# Patient Record
Sex: Male | Born: 1950 | Race: White | Hispanic: No | Marital: Married | Smoking: Never smoker
Health system: Southern US, Community
[De-identification: ages and names within clinical notes are randomized; demographics above are authoritative.]

## PROBLEM LIST (undated history)

## (undated) DIAGNOSIS — N200 Calculus of kidney: Secondary | ICD-10-CM

## (undated) DIAGNOSIS — M1990 Unspecified osteoarthritis, unspecified site: Secondary | ICD-10-CM

## (undated) DIAGNOSIS — IMO0002 Reserved for concepts with insufficient information to code with codable children: Secondary | ICD-10-CM

## (undated) DIAGNOSIS — G4733 Obstructive sleep apnea (adult) (pediatric): Secondary | ICD-10-CM

## (undated) DIAGNOSIS — M109 Gout, unspecified: Secondary | ICD-10-CM

## (undated) DIAGNOSIS — Z91038 Other insect allergy status: Secondary | ICD-10-CM

## (undated) DIAGNOSIS — N529 Male erectile dysfunction, unspecified: Secondary | ICD-10-CM

## (undated) DIAGNOSIS — E7800 Pure hypercholesterolemia, unspecified: Secondary | ICD-10-CM

## (undated) DIAGNOSIS — I38 Endocarditis, valve unspecified: Secondary | ICD-10-CM

## (undated) DIAGNOSIS — R9430 Abnormal result of cardiovascular function study, unspecified: Secondary | ICD-10-CM

## (undated) HISTORY — PX: FEMUR SURGERY: SHX943

## (undated) HISTORY — PX: NASAL SEPTUM SURGERY: SHX37

## (undated) HISTORY — DX: Unspecified osteoarthritis, unspecified site: M19.90

## (undated) HISTORY — DX: Pure hypercholesterolemia, unspecified: E78.00

## (undated) HISTORY — DX: Endocarditis, valve unspecified: I38

## (undated) HISTORY — DX: Gout, unspecified: M10.9

## (undated) HISTORY — DX: Calculus of kidney: N20.0

## (undated) HISTORY — DX: Obstructive sleep apnea (adult) (pediatric): G47.33

## (undated) HISTORY — DX: Other insect allergy status: Z91.038

## (undated) HISTORY — DX: Abnormal result of cardiovascular function study, unspecified: R94.30

## (undated) HISTORY — DX: Reserved for concepts with insufficient information to code with codable children: IMO0002

## (undated) HISTORY — DX: Male erectile dysfunction, unspecified: N52.9

## (undated) HISTORY — PX: COLONOSCOPY: SHX174

---

## 2021-02-14 ENCOUNTER — Encounter: Payer: Self-pay | Admitting: Cardiology

## 2021-02-14 DIAGNOSIS — N529 Male erectile dysfunction, unspecified: Secondary | ICD-10-CM | POA: Insufficient documentation

## 2021-02-14 DIAGNOSIS — M1990 Unspecified osteoarthritis, unspecified site: Secondary | ICD-10-CM | POA: Insufficient documentation

## 2021-02-14 DIAGNOSIS — G4733 Obstructive sleep apnea (adult) (pediatric): Secondary | ICD-10-CM | POA: Insufficient documentation

## 2021-02-14 DIAGNOSIS — E7800 Pure hypercholesterolemia, unspecified: Secondary | ICD-10-CM | POA: Insufficient documentation

## 2021-02-14 DIAGNOSIS — I38 Endocarditis, valve unspecified: Secondary | ICD-10-CM

## 2021-02-14 DIAGNOSIS — R9430 Abnormal result of cardiovascular function study, unspecified: Secondary | ICD-10-CM | POA: Insufficient documentation

## 2021-02-18 DIAGNOSIS — Z91038 Other insect allergy status: Secondary | ICD-10-CM | POA: Insufficient documentation

## 2021-02-18 DIAGNOSIS — M109 Gout, unspecified: Secondary | ICD-10-CM | POA: Insufficient documentation

## 2021-02-20 ENCOUNTER — Other Ambulatory Visit: Payer: Self-pay

## 2021-02-20 ENCOUNTER — Ambulatory Visit (INDEPENDENT_AMBULATORY_CARE_PROVIDER_SITE_OTHER): Payer: Medicare Other | Admitting: Cardiology

## 2021-02-20 ENCOUNTER — Encounter: Payer: Self-pay | Admitting: Cardiology

## 2021-02-20 VITALS — BP 116/78 | HR 99 | Ht 69.0 in | Wt 193.2 lb

## 2021-02-20 DIAGNOSIS — I4819 Other persistent atrial fibrillation: Secondary | ICD-10-CM | POA: Diagnosis not present

## 2021-02-20 DIAGNOSIS — I38 Endocarditis, valve unspecified: Secondary | ICD-10-CM | POA: Diagnosis not present

## 2021-02-20 DIAGNOSIS — G4733 Obstructive sleep apnea (adult) (pediatric): Secondary | ICD-10-CM

## 2021-02-20 DIAGNOSIS — E7800 Pure hypercholesterolemia, unspecified: Secondary | ICD-10-CM

## 2021-02-20 DIAGNOSIS — N200 Calculus of kidney: Secondary | ICD-10-CM | POA: Insufficient documentation

## 2021-02-20 MED ORDER — APIXABAN 5 MG PO TABS: 5.0000 mg | ORAL_TABLET | Freq: Two times a day (BID) | ORAL | 3 refills | Status: DC

## 2021-02-20 NOTE — Progress Notes (Signed)
Cardiology Office Note:    Date:  02/20/2021   ID:  Joseph Lynn, DOB 10-Jul-1950, MRN [id number]  PCP:  Noni Saupe, MD  Cardiologist:  Norman Herrlich, MD   Referring MD: Noni Saupe, MD  ASSESSMENT:    1. Persistent atrial fibrillation (HCC)   2. Hypercholesteremia   3. OSA (obstructive sleep apnea)   4. Valvular heart disease    PLAN:    In order of problems listed above:  He now has persistent atrial fibrillation initiate anticoagulation and referred to EP for consideration isolation versus antiarrhythmic therapy and cardioversion.  He is rate controlled does not require suppression  Stable currently not on lipid-lowering treatment Stable on CPAP My opinion a valvular regurgitation here is trivial physiologic and I told him I do not consider he has valvular heart disease He has a colonoscopy scheduled for tomorrow I told him to ask gastroenterology when he can initiate his anticoagulant  Next appointment as needed with me he will be set up in the next few weeks to be seen by EP   Medication Adjustments/Labs and Tests Ordered: Current medicines are reviewed at length with the patient today.  Concerns regarding medicines are outlined above.  Orders Placed This Encounter  Procedures   Ambulatory referral to Cardiac Electrophysiology   EKG 12-Lead   Meds ordered this encounter  Medications   apixaban (ELIQUIS) 5 MG TABS tablet    Sig: Take 1 tablet (5 mg total) by mouth 2 (two) times daily.    Dispense:  180 tablet    Refill:  3     Chief complaint: Atrial fibrillation  History of Present Illness:    Joseph Lynn is a 70 y.o. male who is being seen today for the evaluation of atrial fibrillation and Other day at the request of [place], John F. II, MD. although His office notes associated irregular heart rhythm when he was in atrial fibrillation at his office  visit                                                                                             There is report of an echocardiogram that was performed February 2021.  Left ventricle normal in size wall thickness systolic EF 57% right ventricle normal size and function there is trivial physiologic valvular regurgitation.  The left and right atrium were described as normal.6666 Recent labs 01/09/2021 showed normal CBC hemoglobin 15.5 platelets 198,000 CMP with a creatinine of 1.0 GFR greater than 60 cc sodium 141 potassium 4.4 otherwise normal CMP cholesterol 200 LDL 133 triglycerides 60 HDL 55 his TSH was normal at that time in the office EKG 01/09/2021 shows rate controlled atrial fibrillation   He and his wife are present participate in evaluation decision making he has a history dating back to February 2021 when he was donating blood he been told that his pulse was irregular he was told he had atrial premature contractions and bradycardia and has been using a device to monitor his rhythm at home.  Initially it was sinus rhythm at times with rates less than 60 but he  was in atrial fibrillation on 01/22/2019 to 01/22/2021 he was in sinus rhythm 01/27/2021 he was in atrial fibrillation 02/15/2021 and asymptomatic controlled atrial fibrillation today. He has no history of thyroid disease he has sleep apnea and uses CPAP. He has moderate alcohol intake 1-2 beers per day He has no history of congenital rheumatic heart disease. He was left with the impression that his echocardiogram showed valvular heart disease and I reassured him that it is within the realm of normal or near normal for his age group and that I do not consider he has valvular heart disease.  We discussed options for treatment the first would be simply to anticoagulate and avoid antiarrhythmic drugs or intervention as he is asymptomatic. Other options include antiarrhythmic drug therapy cardioversion or referral for EP intervention. I think he is ideally suited for EP intervention he is quite interested will be scheduled to be  seen by Dr. Elberta Fortis my office We discussed that anticoagulation with a CHA2DS2-VASc score of 1 is optional however his risk is not 0% and he prefers anticoagulation will initiate today especially if he is going to be considering catheter ablation.  He has not having palpitations syncope shortness of breath chest pain or exercise intolerance. Past Medical History:  Diagnosis Date   Arthritis, degenerative    Erectile dysfunction    Gout    Hymenoptera allergy    Hypercholesteremia    Kidney stones    Left ventricular ejection fraction greater than 40%    OSA (obstructive sleep apnea)    on CPAP   Valvular heart disease     Past Surgical History:  Procedure Laterality Date   COLONOSCOPY     X 4   FEMUR SURGERY     NASAL SEPTUM SURGERY      Current Medications: Current Meds  Medication Sig   apixaban (ELIQUIS) 5 MG TABS tablet Take 1 tablet (5 mg total) by mouth 2 (two) times daily.   EPINEPHrine 0.3 mg/0.3 mL IJ SOAJ injection Inject 0.3 mg into the muscle as needed for anaphylaxis.   sildenafil (VIAGRA) 50 MG tablet Take 50 mg by mouth daily as needed for erectile dysfunction.     Allergies:   Bee venom   Social History   Socioeconomic History   Marital status: Married    Spouse name: Not on file   Number of children: Not on file   Years of education: Not on file   Highest education level: Not on file  Occupational History   Not on file  Tobacco Use   Smoking status: Never   Smokeless tobacco: Never  Vaping Use   Vaping Use: Never used  Substance and Sexual Activity   Alcohol use: Yes    Alcohol/week: 7.0 - 14.0 standard drinks    Types: 7 - 14 Cans of beer per week   Drug use: Never   Sexual activity: Not on file  Other Topics Concern   Not on file  Social History Narrative   Not on file   Social Determinants of Health   Financial Resource Strain: Not on file  Food Insecurity: Not on file  Transportation Needs: Not on file  Physical Activity: Not on  file  Stress: Not on file  Social Connections: Not on file     Family History: The patient's family history includes Cerebral palsy in his sister; Coronary artery disease in his father; Lung cancer in his father; Mental illness in his mother and sister; Ovarian cancer in his mother.  ROS:   ROS Please see the history of present illness.     all other systems reviewed and are negative.  EKGs/Labs/Other Studies Reviewed:    The following studies were reviewed today:   EKG:  EKG is  ordered today.  The ekg ordered today is personally reviewed and demonstrates atrial fibrillation controlled ventricular rate  Recent Labs: 01/09/2021: Cholesterol 200 triglycerides 60 HDL 55 TSH 1.57 hemoglobin 15.5 creatinine 1.0  Physical Exam:    VS:  BP 116/78   Pulse 99   Ht 5\' 9"  (1.753 m)   Wt 193 lb 3.2 oz (87.6 kg)   SpO2 98%   BMI 28.53 kg/m     Wt Readings from Last 3 Encounters:  02/20/21 193 lb 3.2 oz (87.6 kg)  01/09/21 194 lb (88 kg)     GEN:  Well nourished, well developed in no acute distress he looks young for his age appears healthy HEENT: Normal NECK: No JVD; No carotid bruits LYMPHATICS: No lymphadenopathy CARDIAC: Irregular rate and rhythm RRR, no murmurs, rubs, gallops RESPIRATORY:  Clear to auscultation without rales, wheezing or rhonchi  ABDOMEN: Soft, non-tender, non-distended MUSCULOSKELETAL:  No edema; No deformity  SKIN: Warm and dry NEUROLOGIC:  Alert and oriented x 3 PSYCHIATRIC:  Normal affect     Signed, 03/11/21, MD  02/20/2021 12:08 PM    [facility]

## 2021-02-20 NOTE — Patient Instructions (Signed)
Medication Instructions:  Your physician has recommended you make the following change in your medication:  AFTER YOUR COLONOSCOPY START: Eliquis 5 mg take one tablet by mouth daily.  *If you need a refill on your cardiac medications before your next appointment, please call your pharmacy*   Lab Work: None If you have labs (blood work) drawn today and your tests are completely normal, you will receive your results only by: MyChart Message (if you have MyChart) OR A paper copy in the mail If you have any lab test that is abnormal or we need to change your treatment, we will call you to review the results.   Testing/Procedures: None   Follow-Up: At [facility], you and your health needs are our priority.  As part of our continuing mission to provide you with exceptional heart care, we have created designated Provider Care Teams.  These Care Teams include your primary Cardiologist (physician) and Advanced Practice Providers (APPs -  Physician Assistants and Nurse Practitioners) who all work together to provide you with the care you need, when you need it.  We recommend signing up for the patient portal called "MyChart".  Sign up information is provided on this After Visit Summary.  MyChart is used to connect with patients for Virtual Visits (Telemedicine).  Patients are able to view lab/test results, encounter notes, upcoming appointments, etc.  Non-urgent messages can be sent to your provider as well.   To learn more about what you can do with MyChart, go to [email or web address].    Your next appointment:   As needed  The format for your next appointment:   In Person  Provider:   Norman Herrlich, MD    Other Instructions

## 2021-03-25 ENCOUNTER — Ambulatory Visit (INDEPENDENT_AMBULATORY_CARE_PROVIDER_SITE_OTHER): Payer: Medicare Other | Admitting: Cardiology

## 2021-03-25 ENCOUNTER — Other Ambulatory Visit: Payer: Self-pay

## 2021-03-25 ENCOUNTER — Encounter: Payer: Self-pay | Admitting: Cardiology

## 2021-03-25 VITALS — BP 102/74 | HR 55 | Ht 69.0 in | Wt 194.9 lb

## 2021-03-25 DIAGNOSIS — I4819 Other persistent atrial fibrillation: Secondary | ICD-10-CM

## 2021-03-25 DIAGNOSIS — Z01818 Encounter for other preprocedural examination: Secondary | ICD-10-CM | POA: Diagnosis not present

## 2021-03-25 DIAGNOSIS — Z01812 Encounter for preprocedural laboratory examination: Secondary | ICD-10-CM

## 2021-03-25 NOTE — Patient Instructions (Addendum)
Medication Instructions:  Your physician recommends that you continue on your current medications as directed. Please refer to the Current Medication list given to you today.  *If you need a refill on your cardiac medications before your next appointment, please call your pharmacy*   Lab Work: Pre procedure labs between 05/12/2021 - 05/23/2021:  BMP & CBC You do NOT need to be fasting.  You can stop by the [place] office   If you have labs (blood work) drawn today and your tests are completely normal, you will receive your results only by: MyChart Message (if you have MyChart) OR A paper copy in the mail If you have any lab test that is abnormal or we need to change your treatment, we will call you to review the results.   Testing/Procedures: Your physician has requested that you have cardiac CT within 7 days PRIOR to your ablation. Cardiac computed tomography (CT) is a painless test that uses an x-ray machine to take clear, detailed pictures of your heart.  Please follow instruction below located under "other instructions". You will get a call from our office to schedule the date for this test.  Your physician has recommended that you have an ablation. Catheter ablation is a medical procedure used to treat some cardiac arrhythmias (irregular heartbeats). During catheter ablation, a long, thin, flexible tube is put into a blood vessel in your groin (upper thigh), or neck. This tube is called an ablation catheter. It is then guided to your heart through the blood vessel. Radio frequency waves destroy small areas of heart tissue where abnormal heartbeats may cause an arrhythmia to start. Please follow instruction below located under "other instructions".   Follow-Up: At [facility], you and your health needs are our priority.  As part of our continuing mission to provide you with exceptional heart care, we have created designated Provider Care Teams.  These Care Teams include your primary  Cardiologist (physician) and Advanced Practice Providers (APPs -  Physician Assistants and Nurse Practitioners) who all work together to provide you with the care you need, when you need it.  We recommend signing up for the patient portal called "MyChart".  Sign up information is provided on this After Visit Summary.  MyChart is used to connect with patients for Virtual Visits (Telemedicine).  Patients are able to view lab/test results, encounter notes, upcoming appointments, etc.  Non-urgent messages can be sent to your provider as well.   To learn more about what you can do with MyChart, go to [email or web address].    Your next appointment:   1 month(s) after your ablation  The format for your next appointment:   In Person  Provider:   AFib clinic   Thank you for choosing [facility]!!   Dory Horn, RN [phone]    Other Instructions   CT INSTRUCTIONS Your cardiac CT will be scheduled at:  [facility] [address] [place], [place] [address] [phone]  Please arrive at the [facility] main entrance of [facility] 30 minutes prior to test start time. Proceed to the [facility] Department (first floor) to check-in and test prep.   Please follow these instructions carefully (unless otherwise directed):  Hold all erectile dysfunction medications at least 3 days (72 hrs) prior to test.  On the Night Before the Test: Be sure to Drink plenty of water. Do not consume any caffeinated/decaffeinated beverages or chocolate 12 hours prior to your test. Do not take any antihistamines 12  hours prior to your test.  On the Day of the Test: Drink plenty of water until 1 hour prior to the test. Do not eat any food 4 hours prior to the test. You may take your regular medications prior to the test.  HOLD Furosemide/Hydrochlorothiazide morning of the test.      After the Test: Drink plenty of water. After receiving IV contrast, you may  experience a mild flushed feeling. This is normal. On occasion, you may experience a mild rash up to 24 hours after the test. This is not dangerous. If this occurs, you can take Benadryl 25 mg and increase your fluid intake. If you experience trouble breathing, this can be serious. If it is severe call 911 IMMEDIATELY. If it is mild, please call our office. If you take any of these medications: Glipizide/Metformin, Avandament, Glucavance, please do not take 48 hours after completing test unless otherwise instructed.   Once we have confirmed authorization from your insurance company, we will call you to set up a date and time for your test. Based on how quickly your insurance processes prior authorizations requests, please allow up to 4 weeks to be contacted for scheduling your Cardiac CT appointment. Be advised that routine Cardiac CT appointments could be scheduled as many as 8 weeks after your provider has ordered it.  For non-scheduling related questions, please contact the cardiac imaging nurse navigator should you have any questions/concerns: Rockwell Alexandria, Cardiac Imaging Nurse Navigator Larey Brick, Cardiac Imaging Nurse Navigator [facility] Direct Office Dial: [phone]   For scheduling needs, including cancellations and rescheduling, please call [place], [phone].      Electrophysiology/Ablation Procedure Instructions   You are scheduled for a(n)  ablation on 06/06/2021 with Dr. Loman Brooklyn.   1.   Pre procedure testing-             A.  LAB WORK --- between 05/12/2021 - 05/23/2021:  BMP & CBC You do NOT need to be fasting.  You can stop by the [place] office    On the day of your procedure 06/06/2021 you will go to [facility] hospital ([address]) at 5:30 am.  Bonita Quin will go to the main entrance [address] [employer]) and enter where the [employer] are.  Your driver will drop you off and you will head down the hallway to ADMITTING.  You  may have one support person come in to the hospital with you.  They will be asked to wait in the waiting room. It is OK to have someone drop you off and come back when you are ready to be discharged.   3.   Do not eat or drink after midnight prior to your procedure.   4.   On the morning of your procedure do NOT take any medication. Do not miss any doses of your blood thinner prior to the morning of your procedure or your procedure will need to be rescheduled.   5.  Plan for an overnight stay but you may be discharged after your procedure, if you use your phone frequently bring your phone charger. If you are discharged after your procedure you will need someone to drive you home and be with you for 24 hours after your procedure.   6. You will follow up with the AFIB clinic 4 weeks after your procedure.  You will follow up with Dr. Elberta Fortis  3 months after your procedure.  These appointments will be made for  you.   7. FYI: For your safety, and to allow [place] to monitor your vital signs accurately during the surgery/procedure we request that if you have artificial nails, gel coating, SNS etc. Please have those removed prior to your surgery/procedure. Not having the nail coverings /polish removed may result in cancellation or delay of your surgery/procedure.  * If you have ANY questions please call the office [phone] and ask for Fayne Mcguffee RN or send me a MyChart message   * Occasionally, EP Studies and ablations can become lengthy.  Please make your family aware of this before your procedure starts.  Average time ranges from 2-8 hours for EP studies/ablations.  Your physician will call your family after the procedure with the results.                                    Cardiac Ablation Cardiac ablation is a procedure to destroy (ablate) some heart tissue that is sending bad signals. These bad signals cause problems in heart rhythm. The heart has many areas that make these signals. If there are  problems in these areas, they can make the heart beat in a way that is not normal. Destroying some tissues can help make the heart rhythm normal. Tell your doctor about: Any allergies you have. All medicines you are taking. These include vitamins, herbs, eye drops, creams, and over-the-counter medicines. Any problems you or family members have had with medicines that make you fall asleep (anesthetics). Any blood disorders you have. Any surgeries you have had. Any medical conditions you have, such as kidney failure. Whether you are pregnant or may be pregnant. What are the risks? This is a safe procedure. But problems may occur, including: Infection. Bruising and bleeding. Bleeding into the chest. Stroke or blood clots. Damage to nearby areas of your body. Allergies to medicines or dyes. The need for a pacemaker if the normal system is damaged. Failure of the procedure to treat the problem. What happens before the procedure? Medicines Ask your doctor about: Changing or stopping your normal medicines. This is important. Taking aspirin and ibuprofen. Do not take these medicines unless your doctor tells you to take them. Taking other medicines, vitamins, herbs, and supplements. General instructions Follow instructions from your doctor about what you cannot eat or drink. Plan to have someone take you home from the hospital or clinic. If you will be going home right after the procedure, plan to have someone with you for 24 hours. Ask your doctor what steps will be taken to prevent infection. What happens during the procedure?  An IV tube will be put into one of your veins. You will be given a medicine to help you relax. The skin on your neck or groin will be numbed. A cut (incision) will be made in your neck or groin. A needle will be put through your cut and into a large vein. A tube (catheter) will be put into the needle. The tube will be moved to your heart. Dye may be put through  the tube. This helps your doctor see your heart. Small devices (electrodes) on the tube will send out signals. A type of energy will be used to destroy some heart tissue. The tube will be taken out. Pressure will be held on your cut. This helps stop bleeding. A bandage will be put over your cut. The exact procedure may vary among doctors and hospitals. What  happens after the procedure? You will be watched until you leave the hospital or clinic. This includes checking your heart rate, breathing rate, oxygen, and blood pressure. Your cut will be watched for bleeding. You will need to lie still for a few hours. Do not drive for 24 hours or as long as your doctor tells you. Summary Cardiac ablation is a procedure to destroy some heart tissue. This is done to treat heart rhythm problems. Tell your doctor about any medical conditions you may have. Tell him or her about all medicines you are taking to treat them. This is a safe procedure. But problems may occur. These include infection, bruising, bleeding, and damage to nearby areas of your body. Follow what your doctor tells you about food and drink. You may also be told to change or stop some of your medicines. After the procedure, do not drive for 24 hours or as long as your doctor tells you. This information is not intended to replace advice given to you by your health care provider. Make sure you discuss any questions you have with your health care provider. Document Revised: 02/23/2019 Document Reviewed: 02/23/2019 Elsevier Patient Education  2022 [employer].

## 2021-03-25 NOTE — Progress Notes (Signed)
Electrophysiology Office Note   Date:  03/25/2021   ID:  Joseph Lynn, Joseph Lynn June 05, 1950, MRN [id number]  PCP:  Noni Saupe, MD  Cardiologist:  Dulce Sellar Primary Electrophysiologist:  Raye Wiens Joseph Loa, MD    Chief Complaint: AF   History of Present Illness: Joseph Lynn is a 70 y.o. male who is being seen today for the evaluation of AF at the request of Dulce Sellar, Iline Oven, MD. Presenting today for electrophysiology evaluation.  He has a history significant for hyperlipidemia, obstructive sleep apnea, valvular heart disease, and atrial fibrillation.  February 2021, he was donating blood and was found to have an irregular pulse.  He was having PACs and bradycardia.  01/27/2021, he was found to be in atrial fibrillation.  He has no history of thyroid disease.  He does have sleep apnea and uses a CPAP.  He drinks 1-2 beers per day.  He is minimally symptomatic from his atrial fibrillation.  He states that he has potentially some mild shortness of breath.  He is able to do all his daily activities, though when he is atrial fibrillation, moves a little bit slower.  Today, he denies symptoms of palpitations, chest pain, shortness of breath, orthopnea, PND, lower extremity edema, claudication, dizziness, presyncope, syncope, bleeding, or neurologic sequela. The patient is tolerating medications without difficulties.    Past Medical History:  Diagnosis Date   Arthritis, degenerative    Erectile dysfunction    Gout    Hymenoptera allergy    Hypercholesteremia    Kidney stones    Left ventricular ejection fraction greater than 40%    OSA (obstructive sleep apnea)    on CPAP   Valvular heart disease    Past Surgical History:  Procedure Laterality Date   COLONOSCOPY     X 4   FEMUR SURGERY     NASAL SEPTUM SURGERY       Current Outpatient Medications  Medication Sig Dispense Refill   apixaban (ELIQUIS) 5 MG TABS tablet Take 1 tablet (5 mg total) by mouth 2 (two) times daily.  180 tablet 3   EPINEPHrine 0.3 mg/0.3 mL IJ SOAJ injection Inject 0.3 mg into the muscle as needed for anaphylaxis.     FIBER PO Take 1 capsule by mouth daily.     sildenafil (VIAGRA) 50 MG tablet Take 50 mg by mouth daily as needed for erectile dysfunction.     No current facility-administered medications for this visit.    Allergies:   Bee venom   Social History:  The patient  reports that he has never smoked. He has never used smokeless tobacco. He reports current alcohol use of about 7.0 - 14.0 standard drinks per week. He reports that he does not use drugs.   Family History:  The patient's family history includes Cerebral palsy in his sister; Coronary artery disease in his father; Lung cancer in his father; Mental illness in his mother and sister; Ovarian cancer in his mother.    ROS:  Please see the history of present illness.   Otherwise, review of systems is positive for none.   All other systems are reviewed and negative.    PHYSICAL EXAM: VS:  BP 102/74    Pulse (!) 55    Ht 5\' 9"  (1.753 m)    Wt 194 lb 13.9 oz (88.4 kg)    SpO2 97%    BMI 28.78 kg/m  , BMI Body mass index is 28.78 kg/m. GEN: Well nourished, well developed,  in no acute distress  HEENT: normal  Neck: no JVD, carotid bruits, or masses Cardiac: RRR; no murmurs, rubs, or gallops,no edema  Respiratory:  clear to auscultation bilaterally, normal work of breathing GI: soft, nontender, nondistended, + BS MS: no deformity or atrophy  Skin: warm and dry Neuro:  Strength and sensation are intact Psych: euthymic mood, full affect  EKG:  EKG is ordered today. Personal review of the ekg ordered shows sinus rhythm, rate 55  Recent Labs: No results found for requested labs within last 8760 hours.    Lipid Panel  No results found for: CHOL, TRIG, HDL, CHOLHDL, VLDL, LDLCALC, LDLDIRECT   Wt Readings from Last 3 Encounters:  03/25/21 194 lb 13.9 oz (88.4 kg)  02/20/21 193 lb 3.2 oz (87.6 kg)  01/09/21 194 lb (88  kg)      Other studies Reviewed: Additional studies/ records that were reviewed today include: TTE February 2021 Review of the above records today demonstrates:  Ejection fraction 57% Right ventricle normal size and function Trivial physiologic valvular regurgitation Right and left atrium normal size   ASSESSMENT AND PLAN:  1.  Persistent atrial fibrillation: He is converted back to sinus rhythm.  Currently on Eliquis.  CHA2DS2-VASc of 1.  He would like to stay in normal rhythm.  He would like to avoid antiarrhythmic medications.  Due to that, we Merrell Borsuk plan for ablation.  Risk, benefits, and alternatives to EP study and radiofrequency ablation for afib were also discussed in detail today. These risks include but are not limited to stroke, bleeding, vascular damage, tamponade, perforation, damage to the esophagus, lungs, and other structures, pulmonary vein stenosis, worsening renal function, and death. The patient understands these risk and wishes to proceed.  We Delan Ksiazek therefore proceed with catheter ablation at the next available time.  Carto, ICE, anesthesia are requested for the procedure.  Rana Hochstein also obtain CT PV protocol prior to the procedure to exclude LAA thrombus and further evaluate atrial anatomy.   2.  Obstructive sleep apnea: CPAP compliance encouraged  Case discussed with primary cardiology  Current medicines are reviewed at length with the patient today.   The patient does not have concerns regarding his medicines.  The following changes were made today:  none  Labs/ tests ordered today include:  Orders Placed This Encounter  Procedures   CT CARDIAC MORPH/PULM VEIN W/CM&W/O CA SCORE   Basic metabolic panel   CBC   EKG 12-Lead     Disposition:   FU with Chrystine Frogge 3 months  Signed, Detrich Rakestraw Joseph Loa, MD  03/25/2021 12:47 PM     [facility] [address] [place] [address] [phone] (office) [phone] (fax)

## 2021-04-16 ENCOUNTER — Telehealth: Payer: Self-pay | Admitting: Cardiology

## 2021-04-16 ENCOUNTER — Other Ambulatory Visit: Payer: Self-pay

## 2021-04-16 MED ORDER — APIXABAN 5 MG PO TABS: 5.0000 mg | ORAL_TABLET | Freq: Two times a day (BID) | ORAL | 3 refills | Status: DC

## 2021-04-16 NOTE — Telephone Encounter (Signed)
Prescription refill request for Eliquis received. Indication:Afib Last office visit:12/22 Scr:1.0 Age: 71 Weight:88.4 kg  Prescription refilled

## 2021-04-16 NOTE — Telephone Encounter (Signed)
°*  STAT* If patient is at the pharmacy, call can be transferred to refill team.   1. Which medications need to be refilled? (please list name of each medication and dose if known) apixaban (ELIQUIS) 5 MG TABS tablet  2. Which pharmacy/location (including street and city if local pharmacy) is medication to be sent to?       [facility] ([facility] ) - [place], [place] - [address] P: [phone] F: [phone]  3. Do they need a 30 day or 90 day supply? 90 ds

## 2021-05-22 ENCOUNTER — Encounter: Payer: Self-pay | Admitting: *Deleted

## 2021-05-23 LAB — CBC
Hematocrit: 44.9 % (ref 37.5–51.0)
Hemoglobin: 15.6 g/dL (ref 13.0–17.7)
MCH: 33.6 pg — ABNORMAL HIGH (ref 26.6–33.0)
MCHC: 34.7 g/dL (ref 31.5–35.7)
MCV: 97 fL (ref 79–97)
Platelets: 203 10*3/uL (ref 150–450)
RBC: 4.64 x10E6/uL (ref 4.14–5.80)
RDW: 12.6 % (ref 11.6–15.4)
WBC: 5.9 10*3/uL (ref 3.4–10.8)

## 2021-05-23 LAB — BASIC METABOLIC PANEL
BUN/Creatinine Ratio: 25 — ABNORMAL HIGH (ref 10–24)
BUN: 20 mg/dL (ref 8–27)
CO2: 21 mmol/L (ref 20–29)
Calcium: 9 mg/dL (ref 8.6–10.2)
Chloride: 104 mmol/L (ref 96–106)
Creatinine, Ser: 0.79 mg/dL (ref 0.76–1.27)
Glucose: 98 mg/dL (ref 70–99)
Potassium: 4.1 mmol/L (ref 3.5–5.2)
Sodium: 140 mmol/L (ref 134–144)
eGFR: 96 mL/min/{1.73_m2} (ref >59)

## 2021-05-29 ENCOUNTER — Telehealth (HOSPITAL_COMMUNITY): Payer: Self-pay | Admitting: Emergency Medicine

## 2021-05-29 NOTE — Telephone Encounter (Signed)
Reaching out to patient to offer assistance regarding upcoming cardiac imaging study; pt verbalizes understanding of appt date/time, parking situation and where to check in, pre-test NPO status and medications ordered, and verified current allergies; name and call back number provided for further questions should they arise Rockwell Alexandria RN Navigator Cardiac Imaging [facility] [phone] office [phone] cell  Denies iv issues  Arrival 100

## 2021-05-30 ENCOUNTER — Other Ambulatory Visit: Payer: Self-pay

## 2021-05-30 ENCOUNTER — Ambulatory Visit (HOSPITAL_COMMUNITY)
Admission: RE | Admit: 2021-05-30 | Discharge: 2021-05-30 | Disposition: A | Discharge status: Home / Self Care | Payer: Medicare Other | Source: Ambulatory Visit | Attending: Cardiology | Admitting: Cardiology

## 2021-05-30 ENCOUNTER — Encounter (HOSPITAL_COMMUNITY): Payer: Self-pay

## 2021-05-30 DIAGNOSIS — I4819 Other persistent atrial fibrillation: Secondary | ICD-10-CM

## 2021-05-30 MED ORDER — IOHEXOL 350 MG/ML SOLN
100.0000 mL | Freq: Once | INTRAVENOUS | Status: AC | PRN
  Administered 2021-05-30: 100 mL via INTRAVENOUS

## 2021-05-30 MED ORDER — METOPROLOL TARTRATE 5 MG/5ML IV SOLN
5.0000 mg | INTRAVENOUS | Status: DC | PRN
  Administered 2021-05-30: 5 mg via INTRAVENOUS

## 2021-05-30 MED ORDER — METOPROLOL TARTRATE 5 MG/5ML IV SOLN
INTRAVENOUS | Status: AC
  Filled 2021-05-30: qty 5

## 2021-06-05 NOTE — Pre-Procedure Instructions (Signed)
Instructed patient on the following items: ?Arrival time 0530 ?Nothing to eat or drink after midnight ?No meds AM of procedure ?Responsible person to drive you home and stay with you for 24 hrs ? ?Have you missed any doses of anti-coagulant Eliquis- patient missed dose over the weekend.  Notified Dr Elberta Fortis, will arrange TEE on the table for tomorrow. ?   ?

## 2021-06-05 NOTE — Anesthesia Preprocedure Evaluation (Addendum)
Anesthesia Evaluation  ?Patient identified by MRN, date of birth, ID band ?Patient awake ? ? ? ?Reviewed: ?Allergy & Precautions, H&P , NPO status , Patient's Chart, lab work & pertinent test results ? ?Airway ?Mallampati: II ? ?TM Distance: >3 FB ?Neck ROM: Full ? ? ? Dental ?no notable dental hx. ?(+) Teeth Intact, Dental Advisory Given ?  ?Pulmonary ?sleep apnea and Continuous Positive Airway Pressure Ventilation ,  ?  ?Pulmonary exam normal ?breath sounds clear to auscultation ? ? ? ? ? ? Cardiovascular ?Exercise Tolerance: Good ?+ dysrhythmias Atrial Fibrillation  ?Rhythm:Irregular Rate:Normal ? ? ?  ?Neuro/Psych ?negative neurological ROS ? negative psych ROS  ? GI/Hepatic ?negative GI ROS, Neg liver ROS,   ?Endo/Other  ?negative endocrine ROS ? Renal/GU ?negative Renal ROS  ?negative genitourinary ?  ?Musculoskeletal ? ?(+) Arthritis , Osteoarthritis,   ? Abdominal ?  ?Peds ? Hematology ?negative hematology ROS ?(+)   ?Anesthesia Other Findings ? ? Reproductive/Obstetrics ?negative OB ROS ? ?  ? ? ? ? ? ? ? ? ? ? ? ? ? ?  ?  ? ? ? ? ? ? ? ?Anesthesia Physical ?Anesthesia Plan ? ?ASA: 3 ? ?Anesthesia Plan: General  ? ?Post-op Pain Management: Tylenol PO (pre-op)*  ? ?Induction: Intravenous ? ?PONV Risk Score and Plan: 3 and Ondansetron, Dexamethasone and Midazolam ? ?Airway Management Planned: Oral ETT ? ?Additional Equipment:  ? ?Intra-op Plan:  ? ?Post-operative Plan: Extubation in OR ? ?Informed Consent: I have reviewed the patients History and Physical, chart, labs and discussed the procedure including the risks, benefits and alternatives for the proposed anesthesia with the patient or authorized representative who has indicated his/her understanding and acceptance.  ? ? ? ?Dental advisory given ? ?Plan Discussed with: CRNA ? ?Anesthesia Plan Comments:   ? ? ? ? ? ?Anesthesia Quick Evaluation ? ?

## 2021-06-06 ENCOUNTER — Ambulatory Visit (HOSPITAL_COMMUNITY)
Admission: RE | Disposition: A | Discharge status: Home / Self Care | Payer: Self-pay | Source: Home / Self Care | Attending: Cardiology

## 2021-06-06 ENCOUNTER — Ambulatory Visit (HOSPITAL_COMMUNITY): Payer: Medicare Other | Admitting: Anesthesiology

## 2021-06-06 ENCOUNTER — Ambulatory Visit (HOSPITAL_BASED_OUTPATIENT_CLINIC_OR_DEPARTMENT_OTHER): Payer: Medicare Other | Admitting: Anesthesiology

## 2021-06-06 ENCOUNTER — Ambulatory Visit (HOSPITAL_BASED_OUTPATIENT_CLINIC_OR_DEPARTMENT_OTHER): Payer: Medicare Other

## 2021-06-06 ENCOUNTER — Other Ambulatory Visit: Payer: Self-pay

## 2021-06-06 ENCOUNTER — Ambulatory Visit (HOSPITAL_COMMUNITY)
Admission: RE | Admit: 2021-06-06 | Discharge: 2021-06-06 | Disposition: A | Discharge status: Home / Self Care | Payer: Medicare Other | Attending: Cardiology | Admitting: Cardiology

## 2021-06-06 DIAGNOSIS — G4733 Obstructive sleep apnea (adult) (pediatric): Secondary | ICD-10-CM | POA: Diagnosis not present

## 2021-06-06 DIAGNOSIS — I517 Cardiomegaly: Secondary | ICD-10-CM | POA: Diagnosis not present

## 2021-06-06 DIAGNOSIS — I081 Rheumatic disorders of both mitral and tricuspid valves: Secondary | ICD-10-CM

## 2021-06-06 DIAGNOSIS — I4819 Other persistent atrial fibrillation: Secondary | ICD-10-CM | POA: Diagnosis present

## 2021-06-06 DIAGNOSIS — E785 Hyperlipidemia, unspecified: Secondary | ICD-10-CM | POA: Insufficient documentation

## 2021-06-06 DIAGNOSIS — Z9989 Dependence on other enabling machines and devices: Secondary | ICD-10-CM

## 2021-06-06 DIAGNOSIS — I4891 Unspecified atrial fibrillation: Secondary | ICD-10-CM | POA: Diagnosis not present

## 2021-06-06 HISTORY — PX: TEE WITHOUT CARDIOVERSION: SHX5443

## 2021-06-06 HISTORY — PX: ATRIAL FIBRILLATION ABLATION: EP1191

## 2021-06-06 LAB — POCT ACTIVATED CLOTTING TIME
Activated Clotting Time: 287 seconds
Activated Clotting Time: 311 seconds
Activated Clotting Time: 323 seconds

## 2021-06-06 SURGERY — ATRIAL FIBRILLATION ABLATION
Anesthesia: General

## 2021-06-06 MED ORDER — ONDANSETRON HCL 4 MG/2ML IJ SOLN
INTRAMUSCULAR | Status: DC | PRN
  Administered 2021-06-06: 4 mg via INTRAVENOUS

## 2021-06-06 MED ORDER — DOBUTAMINE INFUSION FOR EP/ECHO/NUC (1000 MCG/ML)
INTRAVENOUS | Status: AC
  Filled 2021-06-06: qty 250

## 2021-06-06 MED ORDER — HEPARIN SODIUM (PORCINE) 1000 UNIT/ML IJ SOLN
INTRAMUSCULAR | Status: DC | PRN
  Administered 2021-06-06: 1000 [IU] via INTRAVENOUS

## 2021-06-06 MED ORDER — HEPARIN (PORCINE) IN NACL 1000-0.9 UT/500ML-% IV SOLN
INTRAVENOUS | Status: AC
  Filled 2021-06-06: qty 500

## 2021-06-06 MED ORDER — DEXAMETHASONE SODIUM PHOSPHATE 10 MG/ML IJ SOLN
INTRAMUSCULAR | Status: DC | PRN
Start: 2021-06-06 — End: 2021-06-06
  Administered 2021-06-06: 10 mg via INTRAVENOUS

## 2021-06-06 MED ORDER — ACETAMINOPHEN 500 MG PO TABS
1000.0000 mg | ORAL_TABLET | ORAL | Status: AC
  Administered 2021-06-06: 1000 mg via ORAL
  Filled 2021-06-06: qty 2

## 2021-06-06 MED ORDER — PHENYLEPHRINE 40 MCG/ML (10ML) SYRINGE FOR IV PUSH (FOR BLOOD PRESSURE SUPPORT)
PREFILLED_SYRINGE | INTRAVENOUS | Status: DC | PRN
  Administered 2021-06-06 (×2): 120 ug via INTRAVENOUS
  Administered 2021-06-06: 80 ug via INTRAVENOUS

## 2021-06-06 MED ORDER — SODIUM CHLORIDE 0.9 % IV SOLN: INTRAVENOUS | Status: DC

## 2021-06-06 MED ORDER — ACETAMINOPHEN 500 MG PO TABS
ORAL_TABLET | ORAL | Status: AC
  Filled 2021-06-06: qty 2

## 2021-06-06 MED ORDER — PHENYLEPHRINE HCL-NACL 20-0.9 MG/250ML-% IV SOLN
INTRAVENOUS | Status: DC | PRN
  Administered 2021-06-06: 40 ug/min via INTRAVENOUS

## 2021-06-06 MED ORDER — SUGAMMADEX SODIUM 200 MG/2ML IV SOLN
INTRAVENOUS | Status: DC | PRN
  Administered 2021-06-06: 200 mg via INTRAVENOUS

## 2021-06-06 MED ORDER — SODIUM CHLORIDE 0.9% FLUSH: 3.0000 mL | INTRAVENOUS | Status: DC | PRN

## 2021-06-06 MED ORDER — ONDANSETRON HCL 4 MG/2ML IJ SOLN: 4.0000 mg | Freq: Four times a day (QID) | INTRAMUSCULAR | Status: DC | PRN

## 2021-06-06 MED ORDER — LIDOCAINE 2% (20 MG/ML) 5 ML SYRINGE
INTRAMUSCULAR | Status: DC | PRN
  Administered 2021-06-06: 60 mg via INTRAVENOUS

## 2021-06-06 MED ORDER — DOBUTAMINE INFUSION FOR EP/ECHO/NUC (1000 MCG/ML)
INTRAVENOUS | Status: DC | PRN
  Administered 2021-06-06: 20 ug/kg/min via INTRAVENOUS

## 2021-06-06 MED ORDER — HEPARIN SODIUM (PORCINE) 1000 UNIT/ML IJ SOLN
INTRAMUSCULAR | Status: AC
  Filled 2021-06-06: qty 10

## 2021-06-06 MED ORDER — MIDAZOLAM HCL 2 MG/2ML IJ SOLN
INTRAMUSCULAR | Status: DC | PRN
  Administered 2021-06-06: 2 mg via INTRAVENOUS

## 2021-06-06 MED ORDER — SODIUM CHLORIDE 0.9 % IV SOLN: 250.0000 mL | INTRAVENOUS | Status: DC | PRN

## 2021-06-06 MED ORDER — PROTAMINE SULFATE 10 MG/ML IV SOLN
INTRAVENOUS | Status: DC | PRN
  Administered 2021-06-06: 40 mg via INTRAVENOUS

## 2021-06-06 MED ORDER — PROPOFOL 10 MG/ML IV BOLUS
INTRAVENOUS | Status: DC | PRN
  Administered 2021-06-06: 140 mg via INTRAVENOUS

## 2021-06-06 MED ORDER — APIXABAN 5 MG PO TABS
5.0000 mg | ORAL_TABLET | Freq: Once | ORAL | Status: AC
  Administered 2021-06-06: 5 mg via ORAL
  Filled 2021-06-06 (×2): qty 1

## 2021-06-06 MED ORDER — ACETAMINOPHEN 325 MG PO TABS: 650.0000 mg | ORAL_TABLET | ORAL | Status: DC | PRN

## 2021-06-06 MED ORDER — FENTANYL CITRATE (PF) 100 MCG/2ML IJ SOLN
INTRAMUSCULAR | Status: DC | PRN
  Administered 2021-06-06 (×2): 50 ug via INTRAVENOUS

## 2021-06-06 MED ORDER — HEPARIN (PORCINE) IN NACL 2-0.9 UNITS/ML
INTRAMUSCULAR | Status: AC | PRN
  Administered 2021-06-06 (×5): 500 mL

## 2021-06-06 MED ORDER — ROCURONIUM BROMIDE 10 MG/ML (PF) SYRINGE
PREFILLED_SYRINGE | INTRAVENOUS | Status: DC | PRN
  Administered 2021-06-06: 50 mg via INTRAVENOUS

## 2021-06-06 MED ORDER — HEPARIN SODIUM (PORCINE) 1000 UNIT/ML IJ SOLN
INTRAMUSCULAR | Status: DC | PRN
  Administered 2021-06-06: 2000 [IU] via INTRAVENOUS
  Administered 2021-06-06: 14000 [IU] via INTRAVENOUS
  Administered 2021-06-06: 4000 [IU] via INTRAVENOUS

## 2021-06-06 SURGICAL SUPPLY — 21 items
BAG SNAP BAND KOVER 36X36 (MISCELLANEOUS) ×1 IMPLANT
BLANKET WARM UNDERBOD FULL ACC (MISCELLANEOUS) ×2 IMPLANT
CATH 8FR REPROCESSED SOUNDSTAR (CATHETERS) ×2 IMPLANT
CATH 8FR SOUNDSTAR REPROCESSED (CATHETERS) IMPLANT
CATH OCTARAY 1.5 F (CATHETERS) ×1 IMPLANT
CATH PIGTAIL STEERABLE D1 8.7 (WIRE) ×1 IMPLANT
CATH S CIRCA THERM PROBE 10F (CATHETERS) ×1 IMPLANT
CATH SMTCH THERMOCOOL SF DF (CATHETERS) ×1 IMPLANT
CATH WEBSTER BI DIR CS D-F CRV (CATHETERS) ×1 IMPLANT
CLOSURE PERCLOSE PROSTYLE (VASCULAR PRODUCTS) ×4 IMPLANT
COVER SWIFTLINK CONNECTOR (BAG) ×2 IMPLANT
PACK EP LATEX FREE (CUSTOM PROCEDURE TRAY) ×2
PACK EP LF (CUSTOM PROCEDURE TRAY) ×1 IMPLANT
PAD DEFIB RADIO PHYSIO CONN (PAD) ×2 IMPLANT
PATCH CARTO3 (PAD) ×1 IMPLANT
SHEATH CARTO VIZIGO SM CVD (SHEATH) ×1 IMPLANT
SHEATH PINNACLE 7F 10CM (SHEATH) ×1 IMPLANT
SHEATH PINNACLE 8F 10CM (SHEATH) ×2 IMPLANT
SHEATH PINNACLE 9F 10CM (SHEATH) ×1 IMPLANT
SHEATH PROBE COVER 6X72 (BAG) ×1 IMPLANT
TUBING SMART ABLATE COOLFLOW (TUBING) ×1 IMPLANT

## 2021-06-06 NOTE — Anesthesia Postprocedure Evaluation (Signed)
Anesthesia Post Note ? ?Patient: Joseph Lynn ? ?Procedure(s) Performed: ATRIAL FIBRILLATION ABLATION ?TRANSESOPHAGEAL ECHOCARDIOGRAM (TEE) ? ?  ? ?Patient location during evaluation: PACU ?Anesthesia Type: General ?Level of consciousness: awake and alert ?Pain management: pain level controlled ?Vital Signs Assessment: post-procedure vital signs reviewed and stable ?Respiratory status: spontaneous breathing, nonlabored ventilation and respiratory function stable ?Cardiovascular status: blood pressure returned to baseline and stable ?Postop Assessment: no apparent nausea or vomiting ?Anesthetic complications: no ? ? ?No notable events documented. ? ?Last Vitals:  ?Vitals:  ? 06/06/21 1120 06/06/21 1125  ?BP: (!) 96/58   ?Pulse: 61 64  ?Resp: (!) 21 15  ?Temp:    ?SpO2: 95% 96%  ?  ?Last Pain:  ?Vitals:  ? 06/06/21 1119  ?TempSrc:   ?PainSc: 0-No pain  ? ? ?  ?  ?  ?  ?  ?  ? ?Agnes Brightbill,W. EDMOND ? ? ? ? ?

## 2021-06-06 NOTE — Interval H&P Note (Signed)
History and Physical Interval Note: ? ?06/06/2021 ?8:23 AM ? ?Joseph Lynn  has presented today for surgery, with the diagnosis of afib.  The various methods of treatment have been discussed with the patient and family. After consideration of risks, benefits and other options for treatment, the patient has consented to  Procedure(s): ?ATRIAL FIBRILLATION ABLATION (N/A) ?TRANSESOPHAGEAL ECHOCARDIOGRAM (TEE) (N/A) as a surgical intervention.  The patient's history has been reviewed, patient examined, no change in status, stable for surgery.  I have reviewed the patient's chart and labs.  Questions were answered to the patient's satisfaction.   ? ? ?Shelaine Frie Shirlee Latch ? ? ?

## 2021-06-06 NOTE — Anesthesia Procedure Notes (Signed)
Procedure Name: Intubation ?Date/Time: 06/06/2021 8:06 AM ?Performed by: Pearson Grippe, CRNA ?Pre-anesthesia Checklist: Patient identified, Emergency Drugs available, Suction available and Patient being monitored ?Patient Re-evaluated:Patient Re-evaluated prior to induction ?Oxygen Delivery Method: Circle system utilized ?Preoxygenation: Pre-oxygenation with 100% oxygen ?Induction Type: IV induction ?Ventilation: Mask ventilation without difficulty ?Laryngoscope Size: Hyacinth Meeker and 2 ?Grade View: Grade II ?Tube type: Oral ?Tube size: 7.5 mm ?Number of attempts: 1 ?Airway Equipment and Method: Stylet and Oral airway ?Placement Confirmation: ETT inserted through vocal cords under direct vision, positive ETCO2 and breath sounds checked- equal and bilateral ?Secured at: 22 cm ?Tube secured with: Tape ?Dental Injury: Teeth and Oropharynx as per pre-operative assessment  ? ? ? ? ?

## 2021-06-06 NOTE — Transfer of Care (Signed)
Immediate Anesthesia Transfer of Care Note ? ?Patient: Joseph Lynn ? ?Procedure(s) Performed: ATRIAL FIBRILLATION ABLATION ?TRANSESOPHAGEAL ECHOCARDIOGRAM (TEE) ? ?Patient Location: Cath Lab ? ?Anesthesia Type:General ? ?Level of Consciousness: awake, alert  and oriented ? ?Airway & Oxygen Therapy: Patient Spontanous Breathing and Patient connected to nasal cannula oxygen ? ?Post-op Assessment: Report given to RN and Post -op Vital signs reviewed and stable ? ?Post vital signs: Reviewed and stable ? ?Last Vitals:  ?Vitals Value Taken Time  ?BP 94/58 06/06/21 1029  ?Temp    ?Pulse 77 06/06/21 1030  ?Resp 19 06/06/21 1030  ?SpO2 95 % 06/06/21 1030  ?Vitals shown include unvalidated device data. ? ?Last Pain:  ?Vitals:  ? 06/06/21 0542  ?TempSrc: Oral  ?PainSc:   ?   ? ?  ? ?Complications: No notable events documented. ?

## 2021-06-06 NOTE — H&P (Signed)
? ?Electrophysiology Office Note ? ? ?Date:  06/06/2021  ? ?ID:  Joseph Lynn, DOB [birth year], MRN [id number] ? ?PCP:  Angelina Sheriff, MD  ?Cardiologist:  Bettina Gavia ?Primary Electrophysiologist:  Daphne Karrer Meredith Leeds, MD   ? ?Chief Complaint: AF ?  ?History of Present Illness: ?Joseph Lynn is a 71 y.o. male who is being seen today for the evaluation of AF at the request of No ref. provider found. Presenting today for electrophysiology evaluation. ? ?He has a history significant for hyperlipidemia, obstructive sleep apnea, valvular heart disease, and atrial fibrillation.  February 2021, he was donating blood and was found to have an irregular pulse.  He was having PACs and bradycardia.  01/27/2021, he was found to be in atrial fibrillation.  He has no history of thyroid disease.  He does have sleep apnea and uses a CPAP.  He drinks 1-2 beers per day.  He is minimally symptomatic from his atrial fibrillation.  He states that he has potentially some mild shortness of breath.  He is able to do all his daily activities, though when he is atrial fibrillation, moves a little bit slower. ? ?Today, denies symptoms of palpitations, chest pain, shortness of breath, orthopnea, PND, lower extremity edema, claudication, dizziness, presyncope, syncope, bleeding, or neurologic sequela. The patient is tolerating medications without difficulties. Plan for AF ablation today.  ? ? ?Past Medical History:  ?Diagnosis Date  ? Arthritis, degenerative   ? Erectile dysfunction   ? Gout   ? Hymenoptera allergy   ? Hypercholesteremia   ? Kidney stones   ? Left ventricular ejection fraction greater than 40%   ? OSA (obstructive sleep apnea)   ? on CPAP  ? Valvular heart disease   ? ?Past Surgical History:  ?Procedure Laterality Date  ? COLONOSCOPY    ? X 4  ? FEMUR SURGERY    ? NASAL SEPTUM SURGERY    ? ? ? ?Current Facility-Administered Medications  ?Medication Dose Route Frequency Provider Last Rate Last Admin  ? 0.9 %  sodium chloride  infusion   Intravenous Continuous Constance Haw, MD 50 mL/hr at 06/06/21 0703 Continued from Pre-op at 06/06/21 0703  ? acetaminophen (TYLENOL) 500 MG tablet           ? ? ?Allergies:   Bee venom and Wasp venom  ? ?Social History:  The patient  reports that he has never smoked. He has never used smokeless tobacco. He reports current alcohol use of about 7.0 - 14.0 standard drinks per week. He reports that he does not use drugs.  ? ?Family History:  The patient's family history includes Cerebral palsy in his sister; Coronary artery disease in his father; Lung cancer in his father; Mental illness in his mother and sister; Ovarian cancer in his mother.  ? ?ROS:  Please see the history of present illness.   Otherwise, review of systems is positive for none.   All other systems are reviewed and negative.  ? ?PHYSICAL EXAM: ?VS:  BP 123/72   Pulse 82   Temp 97.7 ?F (36.5 ?C) (Oral)   Resp 17   Ht 5\' 9"  (1.753 m)   Wt 88.5 kg   SpO2 94%   BMI 28.80 kg/m?  , BMI Body mass index is 28.8 kg/m?. ?GEN: Well nourished, well developed, in no acute distress  ?HEENT: normal  ?Neck: no JVD, carotid bruits, or masses ?Cardiac: irregular; no murmurs, rubs, or gallops,no edema  ?Respiratory:  clear to auscultation bilaterally,  normal work of breathing ?GI: soft, nontender, nondistended, + BS ?MS: no deformity or atrophy  ?Skin: warm and dry ?Neuro:  Strength and sensation are intact ?Psych: euthymic mood, full affect ? ?EKG:  EKG is ordered today. ?Personal review of the ekg ordered shows AF  ? ?Recent Labs: ?05/22/2021: BUN 20; Creatinine, Ser 0.79; Hemoglobin 15.6; Platelets 203; Potassium 4.1; Sodium 140  ? ? ?Lipid Panel  ?No results found for: CHOL, TRIG, HDL, CHOLHDL, VLDL, LDLCALC, LDLDIRECT ? ? ?Wt Readings from Last 3 Encounters:  ?06/06/21 88.5 kg  ?03/25/21 88.4 kg  ?02/20/21 87.6 kg  ?  ? ? ?Other studies Reviewed: ?Additional studies/ records that were reviewed today include: TTE February 2021 ?Review of the  above records today demonstrates:  ?Ejection fraction 57% ?Right ventricle normal size and function ?Trivial physiologic valvular regurgitation ?Right and left atrium normal size ? ? ?ASSESSMENT AND PLAN: ? ?1.  Persistent atrial fibrillation: Joseph Lynn has presented today for surgery, with the diagnosis of atrial fibrillation.  The various methods of treatment have been discussed with the patient and family. After consideration of risks, benefits and other options for treatment, the patient has consented to  Procedure(s): ?Catheter ablation as a surgical intervention .  Risks include but not limited to complete heart block, stroke, esophageal damage, nerve damage, bleeding, vascular damage, tamponade, perforation, MI, and death. The patient's history has been reviewed, patient examined, no change in status, stable for surgery.  I have reviewed the patient's chart and labs.  Questions were answered to the patient's satisfaction.   ? ?Allegra Lai, MD ?06/06/2021 ?7:17 AM  ? ?

## 2021-06-06 NOTE — CV Procedure (Signed)
Procedure: TEE ? ?Sedation: Per anesthesiology ? ?Indication: Atrial fibrillation, pre-ablation ? ?Findings: Please see echo section for full report.  The patient was in atrial fibrillation.  Normal LV size and wall thickness.  EF 50%, mild diffuse hypokinesis.  Normal RV size with mild systolic dysfunction.  Mild right atrial enlargement.  Moderate-severe left atrial enlargement, no LA appendage thrombus.  No ASD/PFO by color doppler.  Trivial TR.  Trivial MR.  Trileaflet aortic valve with no significant stenosis or regurgitation.  Normal caliber thoracic aorta with mild plaque.  ? ?Proceed with ablation.  ? ?Joseph Lynn ?06/06/2021 ?8:26 AM ? ?

## 2021-06-06 NOTE — Discharge Instructions (Addendum)
Post procedure care instructions ?No driving for 4 days. No lifting over 5 lbs for 1 week. No vigorous or sexual activity for 1 week. You may return to work/your usual activities on 06/14/21. Keep procedure site clean & dry. If you notice increased pain, swelling, bleeding or pus, call/return!  You may shower after 24 hours, but no soaking in baths/hot tubs/pools for 1 week.  ? ? ?You have an appointment set up with the [facility].  Multiple studies have shown that being followed by a dedicated atrial fibrillation clinic in addition to the standard care you receive from your other physicians improves health. We believe that enrollment in the atrial fibrillation clinic will allow [place] to better care for you.  ? ?The phone number to the [facility] is [phone]. The clinic is staffed Monday through Friday from 8:30am to 5pm. ? ?Parking Directions: The clinic is located in the Heart and Vascular Building connected to [facility]. ?1)From [address] turn on to [address] and go to the 3rd entrance  (Heart and Vascular entrance) on the right. ?2)Look to the right for Heart &Vascular Parking Garage. ?3)A code for the entrance is required, for April is 1104.   ?4)Take the elevators to the 1st floor. Registration is in the room with the glass walls at the end of the hallway. ? ?If you have any trouble parking or locating the clinic, please don?t hesitate to call [phone]. Cardiac Ablation, Care After ? ?This sheet gives you information about how to care for yourself after your procedure. Your health care provider may also give you more specific instructions. If you have problems or questions, contact your health care provider. ?What can I expect after the procedure? ?After the procedure, it is common to have: ?Bruising around your puncture site. ?Tenderness around your puncture site. ?Skipped heartbeats. ?Tiredness (fatigue). ? ?Follow these instructions at home: ?Puncture  site care  ?Follow instructions from your health care provider about how to take care of your puncture site. Make sure you: ?If present, leave stitches (sutures), skin glue, or adhesive strips in place. These skin closures may need to stay in place for up to 2 weeks. If adhesive strip edges start to loosen and curl up, you may trim the loose edges. Do not remove adhesive strips completely unless your health care provider tells you to do that. ?If a large square bandage is present, this may be removed 24 hours after surgery.  ?Check your puncture site every day for signs of infection. Check for: ?Redness, swelling, or pain. ?Fluid or blood. If your puncture site starts to bleed, lie down on your back, apply firm pressure to the area, and contact your health care provider. ?Warmth. ?Pus or a bad smell. ?Driving ?Do not drive for at least 4 days after your procedure or however long your health care provider recommends. (Do not resume driving if you have previously been instructed not to drive for other health reasons.) ?Do not drive or use heavy machinery while taking prescription pain medicine. ?Activity ?Avoid activities that take a lot of effort for at least 7 days after your procedure. ?Do not lift anything that is heavier than 5 lb (4.5 kg) for one week.  ?No sexual activity for 1 week.  ?Return to your normal activities as told by your health care provider. Ask your health care provider what activities are safe for you. ?General instructions ?Take over-the-counter and prescription medicines only as told by your health care provider. ?Do  not use any products that contain nicotine or tobacco, such as cigarettes and e-cigarettes. If you need help quitting, ask your health care provider. ?You may shower after 24 hours, but Do not take baths, swim, or use a hot tub for 1 week.  ?Do not drink alcohol for 24 hours after your procedure. ?Keep all follow-up visits as told by your health care provider. This is  important. ?Contact a health care provider if: ?You have redness, mild swelling, or pain around your puncture site. ?You have fluid or blood coming from your puncture site that stops after applying firm pressure to the area. ?Your puncture site feels warm to the touch. ?You have pus or a bad smell coming from your puncture site. ?You have a fever. ?You have chest pain or discomfort that spreads to your neck, jaw, or arm. ?You are sweating a lot. ?You feel nauseous. ?You have a fast or irregular heartbeat. ?You have shortness of breath. ?You are dizzy or light-headed and feel the need to lie down. ?You have pain or numbness in the arm or leg closest to your puncture site. ?Get help right away if: ?Your puncture site suddenly swells. ?Your puncture site is bleeding and the bleeding does not stop after applying firm pressure to the area. ?These symptoms may represent a serious problem that is an emergency. Do not wait to see if the symptoms will go away. Get medical help right away. Call your local emergency services (911 in the [place] Do not drive yourself to the hospital. ?Summary ?After the procedure, it is normal to have bruising and tenderness at the puncture site in your groin, neck, or forearm. ?Check your puncture site every day for signs of infection. ?Get help right away if your puncture site is bleeding and the bleeding does not stop after applying firm pressure to the area. This is a medical emergency. ?This information is not intended to replace advice given to you by your health care provider. Make sure you discuss any questions you have with your health care provider. ?  ?

## 2021-06-06 NOTE — Progress Notes (Signed)
Right groin oozing pressure held 15 minutes/ new dressing applied no further bleeding ?

## 2021-06-09 ENCOUNTER — Encounter (HOSPITAL_COMMUNITY): Payer: Self-pay | Admitting: Cardiology

## 2021-06-09 MED FILL — Heparin Sod (Porcine)-NaCl IV Soln 1000 Unit/500ML-0.9%: INTRAVENOUS | Qty: 2000 | Status: AC

## 2021-06-09 MED FILL — Gentamicin Sulfate Inj 40 MG/ML: INTRAMUSCULAR | Qty: 80 | Status: CN

## 2021-06-09 MED FILL — Heparin Sod (Porcine)-NaCl IV Soln 1000 Unit/500ML-0.9%: INTRAVENOUS | Qty: 500 | Status: AC

## 2021-06-11 ENCOUNTER — Telehealth: Payer: Self-pay | Admitting: Cardiology

## 2021-06-11 NOTE — Telephone Encounter (Signed)
Pt advised to continue monitoring. ?Advised to call office if grows to size of quarter/larger and/or tenderness worsens. ?Patient verbalized understanding and agreeable to plan.  ? ?

## 2021-06-11 NOTE — Telephone Encounter (Signed)
Right side pt has a small tender spot and a lump the size of a nickel. Pt would like to know is there anything he needs to do.. does he need to be seen? Please advise  ?

## 2021-07-08 ENCOUNTER — Encounter (HOSPITAL_COMMUNITY): Payer: Self-pay | Admitting: Nurse Practitioner

## 2021-07-08 ENCOUNTER — Emergency Department (HOSPITAL_COMMUNITY)
Admission: EM | Admit: 2021-07-08 | Discharge: 2021-07-08 | Disposition: A | Discharge status: Home / Self Care | Payer: Medicare Other | Attending: Emergency Medicine | Admitting: Emergency Medicine

## 2021-07-08 ENCOUNTER — Ambulatory Visit (HOSPITAL_BASED_OUTPATIENT_CLINIC_OR_DEPARTMENT_OTHER)
Admission: RE | Admit: 2021-07-08 | Discharge: 2021-07-08 | Disposition: A | Discharge status: Home / Self Care | Payer: Medicare Other | Source: Ambulatory Visit | Attending: Nurse Practitioner | Admitting: Nurse Practitioner

## 2021-07-08 ENCOUNTER — Emergency Department (HOSPITAL_COMMUNITY): Payer: Medicare Other

## 2021-07-08 ENCOUNTER — Other Ambulatory Visit: Payer: Self-pay

## 2021-07-08 ENCOUNTER — Encounter (HOSPITAL_COMMUNITY): Payer: Self-pay

## 2021-07-08 VITALS — BP 90/84 | HR 153 | Ht 69.0 in | Wt 198.4 lb

## 2021-07-08 DIAGNOSIS — Z7901 Long term (current) use of anticoagulants: Secondary | ICD-10-CM | POA: Insufficient documentation

## 2021-07-08 DIAGNOSIS — I484 Atypical atrial flutter: Secondary | ICD-10-CM | POA: Diagnosis not present

## 2021-07-08 DIAGNOSIS — I4891 Unspecified atrial fibrillation: Secondary | ICD-10-CM | POA: Diagnosis present

## 2021-07-08 DIAGNOSIS — I4892 Unspecified atrial flutter: Secondary | ICD-10-CM | POA: Insufficient documentation

## 2021-07-08 DIAGNOSIS — I959 Hypotension, unspecified: Secondary | ICD-10-CM | POA: Insufficient documentation

## 2021-07-08 DIAGNOSIS — I4819 Other persistent atrial fibrillation: Secondary | ICD-10-CM

## 2021-07-08 LAB — CBC WITH DIFFERENTIAL/PLATELET
Abs Immature Granulocytes: 0.02 10*3/uL (ref 0.00–0.07)
Basophils Absolute: 0 10*3/uL (ref 0.0–0.1)
Basophils Relative: 0 %
Eosinophils Absolute: 0.1 10*3/uL (ref 0.0–0.5)
Eosinophils Relative: 1 %
HCT: 47.2 % (ref 39.0–52.0)
Hemoglobin: 15.9 g/dL (ref 13.0–17.0)
Immature Granulocytes: 0 %
Lymphocytes Relative: 29 %
Lymphs Abs: 2.2 10*3/uL (ref 0.7–4.0)
MCH: 33.3 pg (ref 26.0–34.0)
MCHC: 33.7 g/dL (ref 30.0–36.0)
MCV: 98.7 fL (ref 80.0–100.0)
Monocytes Absolute: 0.5 10*3/uL (ref 0.1–1.0)
Monocytes Relative: 7 %
Neutro Abs: 4.5 10*3/uL (ref 1.7–7.7)
Neutrophils Relative %: 63 %
Platelets: 210 10*3/uL (ref 150–400)
RBC: 4.78 MIL/uL (ref 4.22–5.81)
RDW: 12.6 % (ref 11.5–15.5)
WBC: 7.3 10*3/uL (ref 4.0–10.5)
nRBC: 0 % (ref 0.0–0.2)

## 2021-07-08 LAB — COMPREHENSIVE METABOLIC PANEL
ALT: 25 U/L (ref 0–44)
AST: 20 U/L (ref 15–41)
Albumin: 4.2 g/dL (ref 3.5–5.0)
Alkaline Phosphatase: 80 U/L (ref 38–126)
Anion gap: 9 (ref 5–15)
BUN: 19 mg/dL (ref 8–23)
CO2: 28 mmol/L (ref 22–32)
Calcium: 9.6 mg/dL (ref 8.9–10.3)
Chloride: 105 mmol/L (ref 98–111)
Creatinine, Ser: 0.94 mg/dL (ref 0.61–1.24)
GFR, Estimated: >60 mL/min (ref >60)
Glucose, Bld: 107 mg/dL — ABNORMAL HIGH (ref 70–99)
Potassium: 4.1 mmol/L (ref 3.5–5.1)
Sodium: 142 mmol/L (ref 135–145)
Total Bilirubin: 1.3 mg/dL — ABNORMAL HIGH (ref 0.3–1.2)
Total Protein: 7.1 g/dL (ref 6.5–8.1)

## 2021-07-08 MED ORDER — DILTIAZEM LOAD VIA INFUSION
15.0000 mg | Freq: Once | INTRAVENOUS | Status: AC
Start: 2021-07-08 — End: 2021-07-08
  Administered 2021-07-08: 15 mg via INTRAVENOUS
  Filled 2021-07-08: qty 15

## 2021-07-08 MED ORDER — DILTIAZEM HCL ER COATED BEADS 120 MG PO CP24: 120.0000 mg | ORAL_CAPSULE | Freq: Every day | ORAL | 0 refills | Status: DC

## 2021-07-08 MED ORDER — FENTANYL CITRATE PF 50 MCG/ML IJ SOSY
25.0000 ug | PREFILLED_SYRINGE | Freq: Once | INTRAMUSCULAR | Status: AC
  Administered 2021-07-08: 25 ug via INTRAVENOUS
  Filled 2021-07-08: qty 1

## 2021-07-08 MED ORDER — DILTIAZEM HCL-DEXTROSE 125-5 MG/125ML-% IV SOLN (PREMIX)
5.0000 mg/h | INTRAVENOUS | Status: DC
  Administered 2021-07-08: 5 mg/h via INTRAVENOUS
  Filled 2021-07-08: qty 125

## 2021-07-08 MED ORDER — FLECAINIDE ACETATE 100 MG PO TABS
300.0000 mg | ORAL_TABLET | Freq: Once | ORAL | Status: AC
  Administered 2021-07-08: 300 mg via ORAL
  Filled 2021-07-08: qty 3

## 2021-07-08 MED ORDER — APIXABAN 5 MG PO TABS: 5.0000 mg | ORAL_TABLET | Freq: Two times a day (BID) | ORAL | Status: DC

## 2021-07-08 MED ORDER — ETOMIDATE 2 MG/ML IV SOLN
8.0000 mg | Freq: Once | INTRAVENOUS | Status: AC
  Administered 2021-07-08: 8 mg via INTRAVENOUS
  Filled 2021-07-08: qty 10

## 2021-07-08 NOTE — Progress Notes (Signed)
RT present for cardioversion.  Patient tolerated procedure well with no complications.  ?

## 2021-07-08 NOTE — ED Provider Notes (Signed)
Patient seen after prior EDP and cardiology ? ?Patient with attempted cardioversion of likely atypical atrial fibrillation. ? ?Patient was given flecainide and observed. ? ?Patient appears to remain in atrial fibrillation.  Patient without symptoms.  Patient is comfortable and desires discharge home. ? ?As per cardiology's instructions will prescribed Cardizem for use at home.  Patient has established care with Dr. Elberta Fortis and is to follow-up in the atrial fibrillation clinic. ?  ?Wynetta Fines, MD ?07/08/21 2053 ? ?

## 2021-07-08 NOTE — ED Notes (Signed)
Patient verbalizes understanding of discharge instructions. Opportunity for questioning and answers were provided. Armband removed by staff, pt discharged from ED.  

## 2021-07-08 NOTE — Discharge Instructions (Addendum)
Return for any problem. ? ?Follow-up with Dr. Elberta Lynn in the atrial fibrillation clinic. ? ?Continue to take Eliquis as previously prescribed. ? ?Take Cardizem or diltiazem as prescribed in the evening. ? ?You have you develop heart rates greater than 120 or less than 60 please contact the atrial fibrillation clinic.  If you have chest pain, shortness of breath, palpitations, or feel like you are about to pass out - please seek immediate medical attention. ?

## 2021-07-08 NOTE — ED Notes (Signed)
Pt tolerated cardioversion well. Rate has maintained in the 70-80's. Pt. Vitals are stable, no c/o pain or discomfort.  ?

## 2021-07-08 NOTE — Progress Notes (Signed)
? ?Primary Care Physician: Noni Saupe, MD ?Referring Physician: Dr. Elberta Fortis ? ? ?Joseph Lynn is a 71 y.o. male with a h/o afib that is in the afib clinic for one month f/u visit. His EKG today shows probable atrial flutter at 153 bpm with a BP of 90/84. I reviewed his [name] and has has had persistent fib/flutter at 150-170 bpm since 3/25. No triggers identified. He appears to be tolerating ok in the office today, but the wife states that recently during intercourse, he almost passed out. Kardia strips right after ablation show a sinus bradycardia in the 40's. He is not on rate/rhythm control. I discussed with Dr. Elberta Fortis and since outpatient cardioversion's are running 2 weeks out,  with V/S unstable and not being able to use rate control, it is felt  best to send to ER for an urgent cardioversion. He has not missed any anticoagulation for at least 3 weeks.  ? ?Today, he denies symptoms of palpitations, chest pain, shortness of breath, orthopnea, PND, lower extremity edema, dizziness, presyncope, syncope, or neurologic sequela. The patient is tolerating medications without difficulties and is otherwise without complaint today.  ? ?Past Medical History:  ?Diagnosis Date  ? Arthritis, degenerative   ? Erectile dysfunction   ? Gout   ? Hymenoptera allergy   ? Hypercholesteremia   ? Kidney stones   ? Left ventricular ejection fraction greater than 40%   ? OSA (obstructive sleep apnea)   ? on CPAP  ? Valvular heart disease   ? ?Past Surgical History:  ?Procedure Laterality Date  ? ATRIAL FIBRILLATION ABLATION N/A 06/06/2021  ? Procedure: ATRIAL FIBRILLATION ABLATION;  Surgeon: Regan Lemming, MD;  Location: [facility] INVASIVE CV LAB;  Service: Cardiovascular;  Laterality: N/A;  ? COLONOSCOPY    ? X 4  ? FEMUR SURGERY    ? NASAL SEPTUM SURGERY    ? TEE WITHOUT CARDIOVERSION N/A 06/06/2021  ? Procedure: TRANSESOPHAGEAL ECHOCARDIOGRAM (TEE);  Surgeon: Regan Lemming, MD;  Location: [facility] INVASIVE CV LAB;   Service: Cardiovascular;  Laterality: N/A;  ? ? ?Current Outpatient Medications  ?Medication Sig Dispense Refill  ? apixaban (ELIQUIS) 5 MG TABS tablet Take 1 tablet (5 mg total) by mouth 2 (two) times daily. 180 tablet 3  ? EPINEPHrine 0.3 mg/0.3 mL IJ SOAJ injection Inject 0.3 mg into the muscle as needed for anaphylaxis.    ? polycarbophil (FIBERCON) 625 MG tablet Take 625 mg by mouth with breakfast, with lunch, and with evening meal.    ? sildenafil (VIAGRA) 50 MG tablet Take 50 mg by mouth daily as needed for erectile dysfunction.    ? ?No current facility-administered medications for this encounter.  ? ? ?Allergies  ?Allergen Reactions  ? Bee Venom Swelling  ? Wasp Venom Swelling  ? ? ?Social History  ? ?Socioeconomic History  ? Marital status: Married  ?  Spouse name: Not on file  ? Number of children: Not on file  ? Years of education: Not on file  ? Highest education level: Not on file  ?Occupational History  ? Not on file  ?Tobacco Use  ? Smoking status: Never  ? Smokeless tobacco: Never  ?Vaping Use  ? Vaping Use: Never used  ?Substance and Sexual Activity  ? Alcohol use: Yes  ?  Alcohol/week: 7.0 - 14.0 standard drinks  ?  Types: 7 - 14 Cans of beer per week  ? Drug use: Never  ? Sexual activity: Not on file  ?Other Topics  Concern  ? Not on file  ?Social History Narrative  ? Not on file  ? ?Social Determinants of Health  ? ?Financial Resource Strain: Not on file  ?Food Insecurity: Not on file  ?Transportation Needs: Not on file  ?Physical Activity: Not on file  ?Stress: Not on file  ?Social Connections: Not on file  ?Intimate Partner Violence: Not on file  ? ? ?Family History  ?Problem Relation Age of Onset  ? Ovarian cancer Mother   ? Mental illness Mother   ? Lung cancer Father   ? Coronary artery disease Father   ? Cerebral palsy Sister   ? Mental illness Sister   ? ? ?ROS- All systems are reviewed and negative except as per the HPI above ? ?Physical Exam: ?Vitals:  ? 07/08/21 1004  ?BP: 90/84   ?Pulse: (!) 153  ?Weight: 90 kg  ?Height: 5\' 9"  (1.753 m)  ? ?Wt Readings from Last 3 Encounters:  ?07/08/21 90 kg  ?06/06/21 88.5 kg  ?03/25/21 88.4 kg  ? ? ?Labs: ?Lab Results  ?Component Value Date  ? NA 140 05/22/2021  ? K 4.1 05/22/2021  ? CL 104 05/22/2021  ? CO2 21 05/22/2021  ? GLUCOSE 98 05/22/2021  ? BUN 20 05/22/2021  ? CREATININE 0.79 05/22/2021  ? CALCIUM 9.0 05/22/2021  ? ?No results found for: INR ?No results found for: CHOL, HDL, LDLCALC, TRIG ? ? ?GEN- The patient is well appearing, alert and oriented x 3 today.   ?Head- normocephalic, atraumatic ?Eyes-  Sclera clear, conjunctiva pink ?Ears- hearing intact ?Oropharynx- clear ?Neck- supple, no JVP ?Lymph- no cervical lymphadenopathy ?Lungs- Clear to ausculation bilaterally, normal work of breathing ?Heart- Rapid regular rate and rhythm, no murmurs, rubs or gallops, PMI not laterally displaced ?GI- soft, NT, ND, + BS ?Extremities- no clubbing, cyanosis, or edema ?MS- no significant deformity or atrophy ?Skin- no rash or lesion ?Psych- euthymic mood, full affect ?Neuro- strength and sensation are intact ? ?EKG-Vent. rate 153 BPM ?PR interval 118 ms ?QRS duration 100 ms ?QT/QTcB 300/479 ms ?P-R-T axes * 38 40 ?Sinus tachycardia with Premature ventricular complexes or Fusion complexes ?Marked ST abnormality, possible inferior subendocardial injury ?Abnormal ECG ?When compared with ECG of 06-Jun-2021 13:35, ?PREVIOUS ECG IS PRESENT ? ? ? ?Assessment and Plan:  ?1. Persistent atrial flutter  ?It appears that he has been out of rhythm since 3/25 with HR's as high as 170 bpm ?With hypotension today, and elevated HR, it is not safe to start rate control ?I spoke to Dr. 4/25 the hospital today) and he recommend the ER for an urgent cardioversion (can't schedule Outpatient until 4/19) ?He has HR's in the 40's in rhythm(no rate control on board)  ? ?2. CHA2DS2VASc score of at least 1 ?States no missed doses of eliquis 5 mg bid for at least 3 weeks ? ?To  ER ? ? ?5/19 Elvina Sidle, ANP-C ?Afib Clinic ?[facility] ?[address] ?[place], [place] ?[phone]  ?

## 2021-07-08 NOTE — ED Provider Notes (Addendum)
?[facility] EMERGENCY DEPARTMENT ?Provider Note ? ? ?CSN: [id number] ?Arrival date & time: 07/08/21  1129 ? ?  ? ?History ? ?Chief Complaint  ?Patient presents with  ? Atrial Fibrillation  ?  RVR  ? ? ?Joseph Lynn is a 71 y.o. male. ? ?HPI ? ?  ? ?71 year old male with a history of atrial fibrillation, status post an atrial fibrillation ablation with Dr. Elberta Fortis on March 3, OSA, hypercholesterolemia, presents with concern for atrial flutter from the A-fib clinic. ? ?He reports he has been overall asymptomatic and feeling well, but notes with his cardia monitor watch his heart rate has been reading out of rhythm and fast rhythm over the last week at least.  Rudi Coco, NP evaluated his cardia at the office and noted that he been out of rhythm since March 25.  He denies any chest pain or shortness of breath.  Has not had lightheadedness except for 1 episode during intercourse.  Here he reports he has been taking his Eliquis as scheduled. ? ?Denies recent illness, including no fever, cough, urinary symptoms, no nausea, vomiting, diarrhea, black or bloody stools. ? ?Past Medical History:  ?Diagnosis Date  ? Arthritis, degenerative   ? Erectile dysfunction   ? Gout   ? Hymenoptera allergy   ? Hypercholesteremia   ? Kidney stones   ? Left ventricular ejection fraction greater than 40%   ? OSA (obstructive sleep apnea)   ? on CPAP  ? Valvular heart disease   ?  ? ?Home Medications ?Prior to Admission medications   ?Medication Sig Start Date End Date Taking? Authorizing Provider  ?apixaban (ELIQUIS) 5 MG TABS tablet Take 1 tablet (5 mg total) by mouth 2 (two) times daily. 04/16/21  Yes Camnitz, Andree Coss, MD  ?calcium carbonate (TUMS - DOSED IN MG ELEMENTAL CALCIUM) 500 MG chewable tablet Chew 1 tablet by mouth daily as needed for heartburn.   Yes [provider]  ?EPINEPHrine 0.3 mg/0.3 mL IJ SOAJ injection Inject 0.3 mg into the muscle as needed for anaphylaxis.   Yes [provider]  ?polycarbophil (FIBERCON) 625 MG tablet Take 625 mg by mouth with breakfast, with lunch, and with evening meal.   Yes [provider]  ?sildenafil (VIAGRA) 50 MG tablet Take 50 mg by mouth daily as needed for erectile dysfunction.   Yes [provider]  ?   ? ?Allergies    ?Bee venom and Wasp venom   ? ?Review of Systems   ?Review of Systems ? ?Physical Exam ?Updated Vital Signs ?BP 115/81   Pulse 73   Temp 98.2 ?F (36.8 ?C) (Oral)   Resp 15   Ht 5\' 9"  (1.753 m)   Wt 89.8 kg   SpO2 99%   BMI 29.24 kg/m?  ?Physical Exam ?Vitals and nursing note reviewed.  ?Constitutional:   ?   General: He is not in acute distress. ?   Appearance: He is well-developed. He is not diaphoretic.  ?HENT:  ?   Head: Normocephalic and atraumatic.  ?Eyes:  ?   Conjunctiva/sclera: Conjunctivae normal.  ?Cardiovascular:  ?   Rate and Rhythm: Regular rhythm. Tachycardia present.  ?   Pulses: Normal pulses.  ?   Heart sounds: Normal heart sounds. No murmur heard. ?  No friction rub. No gallop.  ?Pulmonary:  ?   Effort: Pulmonary effort is normal. No respiratory distress.  ?   Breath sounds: Normal breath sounds. No wheezing or rales.  ?Abdominal:  ?  General: There is no distension.  ?   Palpations: Abdomen is soft.  ?   Tenderness: There is no abdominal tenderness. There is no guarding.  ?Musculoskeletal:  ?   Cervical back: Normal range of motion.  ?Skin: ?   General: Skin is warm and dry.  ?Neurological:  ?   Mental Status: He is alert and oriented to person, place, and time.  ? ? ?ED Results / Procedures / Treatments   ?Labs ?(all labs ordered are listed, but only abnormal results are displayed) ?Labs Reviewed  ?COMPREHENSIVE METABOLIC PANEL - Abnormal; Notable for the following components:  ?    Result Value  ? Glucose, Bld 107 (*)   ? Total Bilirubin 1.3 (*)   ? All other components within normal limits  ?CBC WITH DIFFERENTIAL/PLATELET  ?MAGNESIUM  ? ? ?EKG ?EKG Interpretation ? ?Date/Time:  Tuesday July 08 2021 12:07:54 EDT ?Ventricular Rate:  152 ?PR Interval:    ?QRS Duration: 113 ?QT Interval:  327 ?QTC Calculation: 520 ?R Axis:   9 ?Text Interpretation: Atrial flutter Borderline intraventricular conduction delay ST depression, probably rate related Unchanged since ECG earlier today Confirmed by Alvira MondaySchlossman, Yoona Ishii ([id number]) on 07/08/2021 4:06:50 PM ? ?Radiology ?DG Chest Portable 1 View ? ?Result Date: 07/08/2021 ?CLINICAL DATA:  Atrial fibrillation EXAM: PORTABLE CHEST 1 VIEW COMPARISON:  None. FINDINGS: External cardiac pad over the LEFT hemithorax. Normal mediastinum and cardiac silhouette. Normal pulmonary vasculature. No evidence of effusion, infiltrate, or pneumothorax. No acute bony abnormality. IMPRESSION: No acute cardiopulmonary process. Electronically Signed   By: Genevive BiStewart  Edmunds M.D.   On: 07/08/2021 14:58   ? ?Procedures ?Marland Kitchen.Critical Care ?Performed by: Alvira MondaySchlossman, Haven Pylant, MD ?Authorized by: Alvira MondaySchlossman, Raley Novicki, MD  ? ?Critical care provider statement:  ?  Critical care time (minutes):  30 ?  Critical care was time spent personally by me on the following activities:  Development of treatment plan with patient or surrogate, discussions with consultants, evaluation of patient's response to treatment, examination of patient, ordering and review of laboratory studies, ordering and review of radiographic studies, ordering and performing treatments and interventions, pulse oximetry, re-evaluation of patient's condition and review of old charts ?.Sedation ? ?Date/Time: 07/08/2021 4:18 PM ?Performed by: Alvira MondaySchlossman, Juancarlos Crescenzo, MD ?Authorized by: Alvira MondaySchlossman, Lacole Komorowski, MD  ? ?Consent:  ?  Consent obtained:  Verbal ?  Consent given by:  Patient ?  Risks discussed:  Allergic reaction, prolonged hypoxia resulting in organ damage, respiratory compromise necessitating ventilatory assistance and intubation, vomiting, nausea, inadequate sedation and dysrhythmia ?  Alternatives discussed:  Analgesia without sedation ?Universal protocol:  ?   Procedure explained and questions answered to patient or proxy's satisfaction: yes   ?  Relevant documents present and verified: yes   ?  Test results available: yes   ?  Imaging studies available: yes   ?  Required blood products, implants, devices, and special equipment available: yes   ?  Site/side marked: yes   ?  Immediately prior to procedure, a time out was called: yes   ?Indications:  ?  Procedure performed:  Cardioversion ?Pre-sedation assessment:  ?  Time since last food or drink:  6 ?  ASA classification: class 2 - patient with mild systemic disease   ?  Mouth opening:  3 or more finger widths ?  Thyromental distance:  4 finger widths ?  Mallampati score:  I - soft palate, uvula, fauces, pillars visible ?  Neck mobility: normal   ?  Pre-sedation assessments completed and reviewed: airway patency,  cardiovascular function, hydration status, mental status, nausea/vomiting, pain level, respiratory function and temperature   ?Immediate pre-procedure details:  ?  Reassessment: Patient reassessed immediately prior to procedure   ?Procedure details (see MAR for exact dosages):  ?  Total Provider sedation time (minutes):  15 ?Marland KitchenCardioversion ? ?Date/Time: 07/08/2021 4:19 PM ?Performed by: Alvira Monday, MD ?Authorized by: Alvira Monday, MD  ? ?Consent:  ?  Consent obtained:  Written ?  Consent given by:  Patient ?  Risks discussed:  Cutaneous burn, death, pain and induced arrhythmia ?  Alternatives discussed:  No treatment and rate-control medication ?Pre-procedure details:  ?  Cardioversion basis:  Emergent ?  Rhythm:  Atrial flutter ?Patient sedated: Yes. Refer to sedation procedure documentation for details of sedation. ? ?Attempt one:  ?  Cardioversion mode:  Synchronous ?  Shock (Joules):  120 ?  Cardioversion outcome attempt one: brief asystole followed by atrial flutter. ?Attempt two:  ?  Cardioversion mode:  Synchronous ?  Shock (Joules):  150 (brief asystole followed by atrial flutter) ?Post-procedure  details:  ?  Patient status:  Awake ?  Patient tolerance of procedure:  Tolerated well, no immediate complications  ? ? ?Medications Ordered in ED ?Medications  ?apixaban (ELIQUIS) tablet 5 mg (has no administrat

## 2021-07-08 NOTE — ED Triage Notes (Signed)
Pt arrived POV from the a-fib clinic pt has been monitoring his HR at home and came in for a visit and the clinic sent him up here. Pt denies any CP. Pt also states he doesn't really feel his heart racing or palpating.  ?

## 2021-07-08 NOTE — ED Notes (Signed)
Pt denies chest pain, n/v/headache, dizziness or diaphoresis. He does endorse minor right leg swelling.  ?

## 2021-07-08 NOTE — Consult Note (Addendum)
? ?ELECTROPHYSIOLOGY CONSULT  NOTE  ? ? ?Patient ID: Joseph Lynn ?MRN: [id number], DOB/AGE: 70-May-1952 70 y.o. ? ?Admit date: 07/08/2021 ?Date of Consult: 07/08/2021 ? ?Primary Physician: Noni Saupe, MD ?Primary Cardiologist: None  ?Electrophysiologist: Dr. Elberta Fortis ? ?Referring Provider: Dr. Dalene Seltzer ? ?Patient Profile: ?Joseph Lynn is a 71 y.o. male with a history of HLD, OS, valvular heart disease, and AF s/p ablation 06/2020 who is being seen today for the evaluation of AFL with RVR at the request of Dr. Dalene Seltzer. ? ?HPI:  ?Joseph Lynn is a 70 y.o. male with medical history as above. Well known to EP team due to recent ablation.  ? ?He presented to AF clinic today for his one month post ablation visit and was found to likely be in a rapid atrial flutter since approx 3/25. HRs by [name] show rates 150-170.  Overall he states he has been stable symptomatic, but wife reports near syncope during intercourse. He has not been on rate control given sinus bradycardia in the 40s when in rhythm.  ? ?He was sent to the ED for [facility] given unstable rates and inability to use rate control. Pt denied missing any doses of anticoagulation in past 3 weeks.  ? ?[facility] was attempted in ED and was unsuccessful and he was started on diltiazem. EP consulted for further recommendations.  ? ?On my exam, pt is now in 3 or 4:1 flutter in 70s and asymptomatic.  He states he has overall felt well apart from tachy palpitations described as flutter. No syncope, lightheadedness, N/V, or recent illness.  He did have one near syncopal episode during intercourse. He states he paused to catch his breath but wife states she was concerned and he was very different.  ? ?Past Medical History:  ?Diagnosis Date  ? Arthritis, degenerative   ? Erectile dysfunction   ? Gout   ? Hymenoptera allergy   ? Hypercholesteremia   ? Kidney stones   ? Left ventricular ejection fraction greater than 40%   ? OSA (obstructive sleep apnea)   ? on CPAP  ?  Valvular heart disease   ?  ? ?Surgical History:  ?Past Surgical History:  ?Procedure Laterality Date  ? ATRIAL FIBRILLATION ABLATION N/A 06/06/2021  ? Procedure: ATRIAL FIBRILLATION ABLATION;  Surgeon: Regan Lemming, MD;  [facility]: [facility] INV[facility] CV [facility];  Service: Cardiovascular;  Laterality: N/A;  ? COLONOSCOPY    ? X 4  ? FEMUR SURGERY    ? NASAL SEPTUM SURGERY    ? TEE WITHOUT CARDIOVERSION N/A 06/06/2021  ? Procedure: TRANSESOPHAGEAL ECHOCARDIOGRAM (TEE);  Surgeon: Regan Lemming, MD;  Location: [facility] INVASIVE CV LAB;  Service: Cardiovascular;  Laterality: N/A;  ?  ? ?(Not in a hospital admission) ? ? ?Inpatient Medications:  ? diltiazem  15 mg Intravenous Once  ? ? ?Allergies:  ?Allergies  ?Allergen Reactions  ? Bee Venom Swelling  ? Wasp Venom Swelling  ? ? ?Social History  ? ?Socioeconomic History  ? Marital status: Married  ?  Spouse name: Not on file  ? Number of children: Not on file  ? Years of education: Not on file  ? Highest education level: Not on file  ?Occupational History  ? Not on file  ?Tobacco Use  ? Smoking status: Never  ? Smokeless tobacco: Never  ?Vaping Use  ? Vaping Use: Never used  ?Substance and Sexual Activity  ? Alcohol use: Yes  ?  Alcohol/week: 7.0 - 14.0 standard drinks  ?  Types: 7 - 14 Cans of beer per week  ? Drug use: Never  ? Sexual activity: Not on file  ?Other Topics Concern  ? Not on file  ?Social History Narrative  ? Not on file  ? ?Social Determinants of Health  ? ?Financial Resource Strain: Not on file  ?Food Insecurity: Not on file  ?Transportation Needs: Not on file  ?Physical Activity: Not on file  ?Stress: Not on file  ?Social Connections: Not on file  ?Intimate Partner Violence: Not on file  ?  ? ?Family History  ?Problem Relation Age of Onset  ? Ovarian cancer Mother   ? Mental illness Mother   ? Lung cancer Father   ? Coronary artery disease Father   ? Cerebral palsy Sister   ? Mental illness Sister   ?  ? ?Review of Systems: ?All other systems reviewed and are  otherwise negative except as noted above. ? ?Physical Exam: ?Vitals:  ? 07/08/21 1315 07/08/21 1330 07/08/21 1349 07/08/21 1402  ?BP: 96/77 111/83 110/88 (!) 119/93  ?Pulse: (!) 25 (!) 34 (!) 122 75  ?Resp: 18 17 20 13   ?Temp:   98.2 ?F (36.8 ?C)   ?TempSrc:   Oral   ?SpO2: 97% 100% 100% 97%  ?Weight:      ?Height:      ? ? ?GEN- The patient is well appearing, alert and oriented x 3 today.   ?HEENT: normocephalic, atraumatic; sclera clear, conjunctiva pink; hearing intact; oropharynx clear; neck supple ?Lungs- Clear to ausculation bilaterally, normal work of breathing.  No wheezes, rales, rhonchi ?Heart- Regular rate and rhythm, no murmurs, rubs or gallops ?GI- soft, non-tender, non-distended, bowel sounds present ?Extremities- no clubbing, cyanosis, or edema; DP/PT/radial pulses 2+ bilaterally ?MS- no significant deformity or atrophy ?Skin- warm and dry, no rash or lesion ?Psych- euthymic mood, full affect ?Neuro- strength and sensation are intact ? ?Labs: ?  ?Lab Results  ?Component Value Date  ? WBC 7.3 07/08/2021  ? HGB 15.9 07/08/2021  ? HCT 47.2 07/08/2021  ? MCV 98.7 07/08/2021  ? PLT 210 07/08/2021  ?  ?Recent Labs  ?Lab 07/08/21 ?1215  ?NA 142  ?K 4.1  ?CL 105  ?CO2 28  ?BUN 19  ?CREATININE 0.94  ?CALCIUM 9.6  ?PROT 7.1  ?BILITOT 1.3*  ?ALKPHOS 80  ?ALT 25  ?AST 20  ?GLUCOSE 107*  ? ?  ?Radiology/Studies: No results found. ? ?EKG:on arrival likely shows 2:1 atrial flutter (personally reviewed) ? ?TELEMETRY: AFL RVR in 150s initially, likely 2:1, now in AFL in 70-100s. AFL does not appear to have broken with shocks. (personally reviewed) ? ? ?Assessment/Plan: ?1.  Persistent atrial fibrillation / atrial flutter ?S/p PVI and box lesion along posterior wall of the left atrium 06/06/2021 ?ER has started on diltiazem, though worry about this with history of significant bradycardia. ?Pt did not cardiovert, appears to have remained in flutter throughout shocks.   ?Dr. Elberta Fortisamnitz has seen.  ?Review of chart does not  appear to show any symptomatic bradycardia ?Low calcium score and no known CAD ? ?Dr. Elberta Fortisamnitz has seen. Per his plan,  ? ?Delphina Schum give 300 mg of flecainide x 1 and observe for 3 hours for conversion.  ?Can stop diltiazem gtt after, as suspect Coe Angelos be brady.  ? ?If converts, would send home on flecainide 50 mg BID with diltiazem 120 mg at bedtime as long as he is not significantly bradycardic.  ? ?If does not convert, would still send home on diltiazem 120  mg at bedtime alone for rate control with instructions to call AF clinic if rate control remains insufficient. Would plan to schedule outpatient [facility] if rate can be control so that sternal pressure can be applied.  ? ?Either way Iline Buchinger warrant 1 week follow up in AF clinic which I have scheduled.   ? ? ?For questions or updates, please contact [facility] ?Please consult [email or web address] for contact info under Cardiology/STEMI. ? ?Signed, ?Graciella Freer, PA-C  ?07/08/2021 ?2:21 PM ? ? ?I have seen and examined this patient with Otilio Saber.  Agree with above, note added to reflect my findings.  Patient presented to hospital from atrial fibrillation clinic with rapid atrial flutter.  He is status post atrial fibrillation ablation.  He had an attempted cardioversion x2.  Neither time converted him to sinus rhythm.  He currently feels well.  He is on a diltiazem drip with heart rates that are right around 100. ? ?GEN: Well nourished, well developed, in no acute distress  ?HEENT: normal  ?Neck: no JVD, carotid bruits, or masses ?Cardiac: Irregular; no murmurs, rubs, or gallops,no edema  ?Respiratory:  clear to auscultation bilaterally, normal work of breathing ?GI: soft, nontender, nondistended, + BS ?MS: no deformity or atrophy  ?Skin: warm and dry ?Neuro:  Strength and sensation are intact ?Psych: euthymic mood, full affect  ? ?Atrial flutter: Likely atypical atrial flutter.  Patient had attempted cardioversion but did not convert to sinus rhythm.  Due to that, we  Maddyn Lieurance give the patient 300 mg of flecainide.  We Chawn Spraggins continue his IV diltiazem 120 mg.  Would watch him for 3 hours after getting the flecainide.  If he converts to sinus rhythm, would discharge him on flec

## 2021-07-09 ENCOUNTER — Telehealth: Payer: Self-pay | Admitting: Cardiology

## 2021-07-09 NOTE — Telephone Encounter (Signed)
? ?  Pt c/o medication issue: ? ?1. Name of Medication: diltiazem (CARDIZEM CD) 120 MG 24 hr capsule ? ?2. How are you currently taking this medication (dosage and times per day)? Take 1 capsule (120 mg total) by mouth daily. ? ?3. Are you having a reaction (difficulty breathing--STAT)?  ? ?4. What is your medication issue? Pt went to afib clinic and due to his elevated HR and Afib he was brought to the ED. He said he was given a new prescription diltiazem and wanted to check with Dr. Elberta Fortis if its ok for him to start taking it. He wanted to get a callback as soon as possible so he can take his meds ?

## 2021-07-09 NOTE — Telephone Encounter (Signed)
Spoke with pt and advised Diltiazem 120mg  - 1 tablet in the evening was recommended by cardiology while pt was in the ED yesterday due to rapid HR and failed cardioversion.  Pt states he is asymptomatic today and feels fine.   ?Pt states he will have Diltiazem filled and take as prescribed.  He is aware he is to follow up with Afib clinic on 07/16/2021.  Pt thanked 09/15/2021 for the callback. ? ?

## 2021-07-10 NOTE — Telephone Encounter (Signed)
Patient states he took his first dose yesterday and the instructions were to take in evening, but he did not want to wait so he took it around this time yesterday. He would like to know if there is a reason to take it in the evening and if it is okay for him to take it again at this time.   ?

## 2021-07-10 NOTE — Telephone Encounter (Signed)
Pt informed that he may take the Diltiazem whatever time of day that works best for him, aware he does not have to take it at night. ?Patient verbalized understanding and agreeable to plan.  ? ?

## 2021-07-16 ENCOUNTER — Encounter (HOSPITAL_COMMUNITY): Payer: Self-pay | Admitting: Nurse Practitioner

## 2021-07-16 ENCOUNTER — Ambulatory Visit (HOSPITAL_COMMUNITY)
Admit: 2021-07-16 | Discharge: 2021-07-16 | Disposition: A | Discharge status: Home / Self Care | Payer: Medicare Other | Attending: Nurse Practitioner | Admitting: Nurse Practitioner

## 2021-07-16 VITALS — BP 112/64 | HR 48 | Ht 69.0 in | Wt 199.0 lb

## 2021-07-16 DIAGNOSIS — I484 Atypical atrial flutter: Secondary | ICD-10-CM

## 2021-07-16 DIAGNOSIS — D6869 Other thrombophilia: Secondary | ICD-10-CM | POA: Diagnosis not present

## 2021-07-16 DIAGNOSIS — Z79899 Other long term (current) drug therapy: Secondary | ICD-10-CM | POA: Diagnosis not present

## 2021-07-16 DIAGNOSIS — I4892 Unspecified atrial flutter: Secondary | ICD-10-CM | POA: Insufficient documentation

## 2021-07-16 DIAGNOSIS — Z7901 Long term (current) use of anticoagulants: Secondary | ICD-10-CM | POA: Diagnosis not present

## 2021-07-16 MED ORDER — FLECAINIDE ACETATE 50 MG PO TABS: 50.0000 mg | ORAL_TABLET | Freq: Two times a day (BID) | ORAL | 3 refills | Status: DC

## 2021-07-16 NOTE — Progress Notes (Signed)
? ?Primary Care Physician: Noni Saupeedding, John F. II, MD ?Referring Physician: Dr. Elberta Fortisamnitz ? ? ?Joseph Lynn is a 71 y.o. male with a h/o afib that is in the afib clinic for one month f/u visit. His EKG today shows probable atrial flutter at 153 bpm with a BP of 90/84. I reviewed his Lourena SimmondsKardia and has has had persistent fib/flutter at 150-170 bpm since 3/25. No triggers identified. He appears to be tolerating ok in the office today, but the wife states that recently during intercourse, he almost passed out. Kardia strips right after ablation show a sinus bradycardia in the 40's. He is not on rate/rhythm control. I discussed with Dr. Elberta Fortisamnitz and since outpatient cardioversion's are running 2 weeks out,  with V/S unstable and not being able to use rate control, it is felt  best to send to ER for an urgent cardioversion. He has not missed any anticoagulation for at least 3 weeks.  ? ?F/u in afib clinic, 07/16/21. He did not shock out last week in ER but his rate was controlled with IV diltiazem and he was discharged on po diltiazem. The plan was to start flecainide today if he remained out of rhythm and then set up for another cardioversion. EKG shows sinus brady at 48 bpm. Strips reviewed on Kardia and show sinus brady with PAC's at 48-51 bpm, no afib  noted. I spoke to Dr. Elberta Fortisamnitz and he does want him to start on flecainide regardless of rhythm.  ? ?Today, he denies symptoms of palpitations, chest pain, shortness of breath, orthopnea, PND, lower extremity edema, dizziness, presyncope, syncope, or neurologic sequela. The patient is tolerating medications without difficulties and is otherwise without complaint today.  ? ?Past Medical History:  ?Diagnosis Date  ? Arthritis, degenerative   ? Erectile dysfunction   ? Gout   ? Hymenoptera allergy   ? Hypercholesteremia   ? Kidney stones   ? Left ventricular ejection fraction greater than 40%   ? OSA (obstructive sleep apnea)   ? on CPAP  ? Valvular heart disease   ? ?Past  Surgical History:  ?Procedure Laterality Date  ? ATRIAL FIBRILLATION ABLATION N/A 06/06/2021  ? Procedure: ATRIAL FIBRILLATION ABLATION;  Surgeon: Regan Lemmingamnitz, Will Martin, MD;  Location: [facility] INVASIVE CV LAB;  Service: Cardiovascular;  Laterality: N/A;  ? COLONOSCOPY    ? X 4  ? FEMUR SURGERY    ? NASAL SEPTUM SURGERY    ? TEE WITHOUT CARDIOVERSION N/A 06/06/2021  ? Procedure: TRANSESOPHAGEAL ECHOCARDIOGRAM (TEE);  Surgeon: Regan Lemmingamnitz, Will Martin, MD;  [facility];  Service: Cardiovascular;  Laterality: N/A;  ? ? ?Current Outpatient Medications  ?Medication Sig Dispense Refill  ? apixaban (ELIQUIS) 5 MG TABS tablet Take 1 tablet (5 mg total) by mouth 2 (two) times daily. 180 tablet 3  ? calcium carbonate (TUMS - DOSED IN MG ELEMENTAL CALCIUM) 500 MG chewable tablet Chew 1 tablet by mouth daily as needed for heartburn.    ? diltiazem (CARDIZEM CD) 120 MG 24 hr capsule Take 1 capsule (120 mg total) by mouth daily. 30 capsule 0  ? EPINEPHrine 0.3 mg/0.3 mL IJ SOAJ injection Inject 0.3 mg into the muscle as needed for anaphylaxis.    ? polycarbophil (FIBERCON) 625 MG tablet Take 625 mg by mouth with breakfast, with lunch, and with evening meal.    ? sildenafil (VIAGRA) 50 MG tablet Take 50 mg by mouth daily as needed for erectile dysfunction.    ? ?No current facility-administered medications for  this encounter.  ? ? ?Allergies  ?Allergen Reactions  ? Bee Venom Swelling  ? Wasp Venom Swelling  ? ? ?Social History  ? ?Socioeconomic History  ? Marital status: Married  ?  Spouse name: Not on file  ? Number of children: Not on file  ? Years of education: Not on file  ? Highest education level: Not on file  ?Occupational History  ? Not on file  ?Tobacco Use  ? Smoking status: Never  ? Smokeless tobacco: Never  ?Vaping Use  ? Vaping Use: Never used  ?Substance and Sexual Activity  ? Alcohol use: Yes  ?  Alcohol/week: 7.0 - 14.0 standard drinks  ?  Types: 7 - 14 Cans of beer per week  ? Drug use: Never  ? Sexual  activity: Not on file  ?Other Topics Concern  ? Not on file  ?Social History Narrative  ? Not on file  ? ?Social Determinants of Health  ? ?Financial Resource Strain: Not on file  ?Food Insecurity: Not on file  ?Transportation Needs: Not on file  ?Physical Activity: Not on file  ?Stress: Not on file  ?Social Connections: Not on file  ?Intimate Partner Violence: Not on file  ? ? ?Family History  ?Problem Relation Age of Onset  ? Ovarian cancer Mother   ? Mental illness Mother   ? Lung cancer Father   ? Coronary artery disease Father   ? Cerebral palsy Sister   ? Mental illness Sister   ? ? ?ROS- All systems are reviewed and negative except as per the HPI above ? ?Physical Exam: ?Vitals:  ? 07/16/21 0941  ?Weight: 90.3 kg  ?Height: 5\' 9"  (1.753 m)  ? ?Wt Readings from Last 3 Encounters:  ?07/16/21 90.3 kg  ?07/08/21 89.8 kg  ?07/08/21 90 kg  ? ? ?Labs: ?Lab Results  ?Component Value Date  ? NA 142 07/08/2021  ? K 4.1 07/08/2021  ? CL 105 07/08/2021  ? CO2 28 07/08/2021  ? GLUCOSE 107 (H) 07/08/2021  ? BUN 19 07/08/2021  ? CREATININE 0.94 07/08/2021  ? CALCIUM 9.6 07/08/2021  ? ?No results found for: INR ?No results found for: CHOL, HDL, LDLCALC, TRIG ? ? ?GEN- The patient is well appearing, alert and oriented x 3 today.   ?Head- normocephalic, atraumatic ?Eyes-  Sclera clear, conjunctiva pink ?Ears- hearing intact ?Oropharynx- clear ?Neck- supple, no JVP ?Lymph- no cervical lymphadenopathy ?Lungs- Clear to ausculation bilaterally, normal work of breathing ?Heart-  regular rate and rhythm, no murmurs, rubs or gallops, PMI not laterally displaced ?GI- soft, NT, ND, + BS ?Extremities- no clubbing, cyanosis, or edema ?MS- no significant deformity or atrophy ?Skin- no rash or lesion ?Psych- euthymic mood, full affect ?Neuro- strength and sensation are intact ? ?Vent. rate 48 BPM ?PR interval 150 ms ?QRS duration 86 ms ?QT/QTcB 436/389 ms ?P-R-T axes 66 10 8 ?Sinus bradycardia ?Otherwise normal ECG ?When compared with ECG  of 08-Jul-2021 20:48, ?PREVIOUS ECG IS PRESENT ? ? ? ?Assessment and Plan:  ?1. Persistent atrial flutter  ?He was sent to ER from  afib clinic last week for rapid flutter and would not convert with cardioversion. His rate was slowed down with dilt drip  and he was d/c home on 120 mg dilt daily.  ?He is now in a sinus brady at 48 bpm, he converted the next day ?Strips reviewed form Kardia  since conversion show Sinus brady and possible afib, but it was SR with PAC's ?Per Dr. 10-Jul-2021, start on  flecainide 50 mg bid and I will see back next week for EKG. He feels this will likely be stopped at the 3 month f/u ablation visit  ?We will continue on diltiazem 120 mg daily  ? ?2. CHA2DS2VASc score of at least 1 ?Continue eliquis 5 mg bid for at least 3 weeks ? ?F/u with Dr. Elberta Fortis 09/08/21 ? ? ?Elvina Sidle Noralyn Pick, ANP-C ?Afib Clinic ?[facility] ?[address] ?[place], [place] [address] ?[phone]  ?

## 2021-07-19 ENCOUNTER — Telehealth: Payer: Self-pay | Admitting: Student

## 2021-07-19 NOTE — Telephone Encounter (Signed)
? ?  Patient called Answering Service with concerns about heart rate.  Called and spoke with patient.  He has a history of atrial fibrillation and underwent ablation in 06/2021 with Dr. Elberta Fortis.  Since then, he has had problems with atrial flutter. Patient was sent to the ED from the A-Fib clinic on 07/08/2021 with rapid atrial flutter.  DCCV was attempted but was unsuccessful.  He was then started on Cardizem drip and given a dose of Flecainide.  He remained in atrial flutter but rates were controlled. He was discharged on PO Cardizem.  He was seen for follow-up in the A-fib clinic on 07/16/2021 and was in sinus bradycardia with rates in the 40s.  He was started on Flecainide 50 mg twice daily per Dr. Elberta Fortis.  Patient called today and states he initially did well with this but then started noticing elevated heart rates on evening of 07/17/2021.  Rates remain elevated this morning in the 120s to 140s.  He is relatively asymptomatic with this other than palpitations.  He strongly feels like this is actually due to the Flecainide and would like to stop this.  I did review side effects of Flecainide on epocrates and per epocrates Flecaindie can cause CHB as well as ventricular arrhythmias. I did discuss that is sounds like he was back in atrial flutter and Flecanide was added to help with this but he really thinks the Flecainide caused this recurrence. Given he is asymptomatic, OK to hold Flecainide. Do not want to increase Cardizem given prior bradycardia. He has a follow-up visit with Rudi Coco, NP, this coming week on 07/23/2021. Emphasized with patient that if his heart rates remain elevated and he develops any symptoms (worsening palpitations, lightheadedness/dizziness, chest pain, shortness of breath), then he needs to be seen in the ED. Patient voiced understanding and agreed. ? ?I will route this note to Rudi Coco, NP, as an heads up in case she has any additional recommendations prior to her visit with  him. ? ?Corrin Parker, PA-C ?07/19/2021 9:32 AM ? ? ?

## 2021-07-23 ENCOUNTER — Ambulatory Visit (HOSPITAL_COMMUNITY)
Admission: RE | Admit: 2021-07-23 | Discharge: 2021-07-23 | Disposition: A | Discharge status: Home / Self Care | Payer: Medicare Other | Source: Ambulatory Visit | Attending: Nurse Practitioner | Admitting: Nurse Practitioner

## 2021-07-23 VITALS — BP 116/66 | HR 49

## 2021-07-23 DIAGNOSIS — I491 Atrial premature depolarization: Secondary | ICD-10-CM | POA: Diagnosis not present

## 2021-07-23 DIAGNOSIS — I484 Atypical atrial flutter: Secondary | ICD-10-CM

## 2021-07-23 DIAGNOSIS — Z79899 Other long term (current) drug therapy: Secondary | ICD-10-CM | POA: Diagnosis present

## 2021-07-23 NOTE — Progress Notes (Signed)
Pt in for EKG after starting flecainide 50 mg bid. He however stopped it after being on drug for 1-2 days as he  went back into a rapid rhythm up to  150 bpm. He called after call services and he wanted to stop drug as he felt that flecainide caused the RVR. He went in and out for 2 days and for the last 2 days has been in normal rhythm. He prefers to see how he does without any further antiarrhythmics. F/u with Dr. Elberta Fortis 09/08/21.  ? ?Vent. rate 49 BPM ?PR interval 146 ms ?QRS duration 98 ms ?QT/QTcB 448/404 ms ?P-R-T axes 61 13 20 ?Sinus bradycardia with Premature supraventricular complexes ?Otherwise normal ECG ?When compared with ECG of 16-Jul-2021 09:53, ?PREVIOUS ECG IS PRESENT ?

## 2021-07-29 ENCOUNTER — Other Ambulatory Visit (HOSPITAL_COMMUNITY): Payer: Self-pay | Admitting: *Deleted

## 2021-07-29 MED ORDER — DILTIAZEM HCL ER COATED BEADS 120 MG PO CP24: 120.0000 mg | ORAL_CAPSULE | Freq: Every day | ORAL | 4 refills | Status: DC

## 2021-09-08 ENCOUNTER — Encounter: Payer: Self-pay | Admitting: Cardiology

## 2021-09-08 ENCOUNTER — Ambulatory Visit (INDEPENDENT_AMBULATORY_CARE_PROVIDER_SITE_OTHER): Payer: Medicare Other | Admitting: Cardiology

## 2021-09-08 VITALS — BP 112/78 | HR 55 | Ht 69.0 in | Wt 196.6 lb

## 2021-09-08 DIAGNOSIS — I4819 Other persistent atrial fibrillation: Secondary | ICD-10-CM

## 2021-09-08 NOTE — Patient Instructions (Addendum)
Medication Instructions:  Your physician has recommended you make the following change in your medication:  STOP Eliquis STOP Diltiazem  *If you need a refill on your cardiac medications before your next appointment, please call your pharmacy*   Lab Work: None ordered   Testing/Procedures: None ordered   Follow-Up: At [facility], you and your health needs are our priority.  As part of our continuing mission to provide you with exceptional heart care, we have created designated Provider Care Teams.  These Care Teams include your primary Cardiologist (physician) and Advanced Practice Providers (APPs -  Physician Assistants and Nurse Practitioners) who all work together to provide you with the care you need, when you need it.  We recommend signing up for the patient portal called "MyChart".  Sign up information is provided on this After Visit Summary.  MyChart is used to connect with patients for Virtual Visits (Telemedicine).  Patients are able to view lab/test results, encounter notes, upcoming appointments, etc.  Non-urgent messages can be sent to your provider as well.   To learn more about what you can do with MyChart, go to [email or web address].    Your next appointment:   6 month(s)  The format for your next appointment:   In Person  Provider:   Loman Brooklyn, MD    Thank you for choosing [facility]!!   Dory Horn, RN [phone]  Other Instructions   Important Information About Sugar

## 2021-09-08 NOTE — Progress Notes (Signed)
Electrophysiology Office Note   Date:  09/08/2021   ID:  Joseph Lynn, DOB July 27, 1950, MRN [id number]  PCP:  Angelina Sheriff, MD  Cardiologist:  Bettina Gavia Primary Electrophysiologist:  Grady Lucci Meredith Leeds, MD    Chief Complaint: AF   History of Present Illness: Joseph Lynn is a 71 y.o. male who is being seen today for the evaluation of AF at the request of Angelina Sheriff, MD. Presenting today for electrophysiology evaluation.  He has a history seen for hyperlipidemia, obstructive sleep apnea, valvular heart disease, atrial fibrillation.  In 2021 he was donating blood and was found to have an irregular pulse.  01/27/2021 was found to be in atrial fibrillation.  He is status post atrial fibrillation ablation 06/06/2021. Unfortunately had more frequent episodes ablation and required cardioversion.  When he got back into normal rhythm, he has stayed in normal rhythm.  He has not had any atrial fibrillation since April 2023.  Today, denies symptoms of palpitations, chest pain, shortness of breath, orthopnea, PND, lower extremity edema, claudication, dizziness, presyncope, syncope, bleeding, or neurologic sequela. The patient is tolerating medications without difficulties.    Past Medical History:  Diagnosis Date   Arthritis, degenerative    Erectile dysfunction    Gout    Hymenoptera allergy    Hypercholesteremia    Kidney stones    Left ventricular ejection fraction greater than 40%    OSA (obstructive sleep apnea)    on CPAP   Valvular heart disease    Past Surgical History:  Procedure Laterality Date   ATRIAL FIBRILLATION ABLATION N/A 06/06/2021   Procedure: ATRIAL FIBRILLATION ABLATION;  Surgeon: Constance Haw, MD;  Location: [facility];  Service: Cardiovascular;  Laterality: N/A;   COLONOSCOPY     X 4   FEMUR SURGERY     NASAL SEPTUM SURGERY     TEE WITHOUT CARDIOVERSION N/A 06/06/2021   Procedure: TRANSESOPHAGEAL ECHOCARDIOGRAM (TEE);  Surgeon:  Constance Haw, MD;  Location: [facility];  Service: Cardiovascular;  Laterality: N/A;     Current Outpatient Medications  Medication Sig Dispense Refill   calcium carbonate (TUMS - DOSED IN MG ELEMENTAL CALCIUM) 500 MG chewable tablet Chew 1 tablet by mouth daily as needed for heartburn.     EPINEPHrine 0.3 mg/0.3 mL IJ SOAJ injection Inject 0.3 mg into the muscle as needed for anaphylaxis.     polycarbophil (FIBERCON) 625 MG tablet Take 625 mg by mouth with breakfast, with lunch, and with evening meal.     sildenafil (VIAGRA) 50 MG tablet Take 50 mg by mouth daily as needed for erectile dysfunction.     No current facility-administered medications for this visit.    Allergies:   Bee venom, Flecainide, and Wasp venom   Social History:  The patient  reports that he has never smoked. He has never used smokeless tobacco. He reports current alcohol use of about 7.0 - 14.0 standard drinks per week. He reports that he does not use drugs.   Family History:  The patient's family history includes Cerebral palsy in his sister; Coronary artery disease in his father; Lung cancer in his father; Mental illness in his mother and sister; Ovarian cancer in his mother.   ROS:  Please see the history of present illness.   Otherwise, review of systems is positive for none.   All other systems are reviewed and negative.   PHYSICAL EXAM: VS:  BP 112/78   Pulse Marland Kitchen)  55   Ht 5\' 9"  (1.753 m)   Wt 196 lb 9.6 oz (89.2 kg)   SpO2 96%   BMI 29.03 kg/m  , BMI Body mass index is 29.03 kg/m. GEN: Well nourished, well developed, in no acute distress  HEENT: normal  Neck: no JVD, carotid bruits, or masses Cardiac: RRR; no murmurs, rubs, or gallops,no edema  Respiratory:  clear to auscultation bilaterally, normal work of breathing GI: soft, nontender, nondistended, + BS MS: no deformity or atrophy  Skin: warm and dry Neuro:  Strength and sensation are intact Psych: euthymic mood, full  affect  EKG:  EKG is ordered today. Personal review of the ekg ordered shows sinus rhythm   Recent Labs: 07/08/2021: ALT 25; BUN 19; Creatinine, Ser 0.94; Hemoglobin 15.9; Platelets 210; Potassium 4.1; Sodium 142    Lipid Panel  No results found for: CHOL, TRIG, HDL, CHOLHDL, VLDL, LDLCALC, LDLDIRECT   Wt Readings from Last 3 Encounters:  09/08/21 196 lb 9.6 oz (89.2 kg)  07/16/21 199 lb (90.3 kg)  07/08/21 198 lb (89.8 kg)      Other studies Reviewed: Additional studies/ records that were reviewed today include: TTE February 2021 Review of the above records today demonstrates:  Ejection fraction 57% Right ventricle normal size and function Trivial physiologic valvular regurgitation Right and left atrium normal size   ASSESSMENT AND PLAN:  1.  Persistent atrial fibrillation: Currently on Eliquis 5 mg twice daily.  CHA2DS2-VASc of 1.  Status post ablation 06/06/2021.  He had episodes of atrial fibrillation and atrial flutter post procedure, but is not had any over the last few months.  We Joseph Lynn stop both Eliquis and diltiazem today.  2.  Obstructive sleep apnea: CPAP compliance encouraged  Current medicines are reviewed at length with the patient today.   The patient does not have concerns regarding his medicines.  The following changes were made today: Stop Eliquis, diltiazem  Labs/ tests ordered today include:  Orders Placed This Encounter  Procedures   EKG 12-Lead     Disposition:   FU with Joseph Lynn 6 months  Signed, Jeanita Carneiro Meredith Leeds, MD  09/08/2021 10:14 AM     [facility] [address] [place] [address] [phone] (office) [phone] (fax)

## 2022-05-14 ENCOUNTER — Encounter (HOSPITAL_COMMUNITY): Payer: Self-pay | Admitting: *Deleted

## 2022-07-20 ENCOUNTER — Ambulatory Visit: Payer: Medicare Other | Admitting: Cardiology

## 2022-07-24 ENCOUNTER — Ambulatory Visit: Payer: Medicare Other | Attending: Cardiology | Admitting: Cardiology

## 2022-07-24 ENCOUNTER — Encounter: Payer: Self-pay | Admitting: Cardiology

## 2022-07-24 VITALS — BP 120/72 | HR 52 | Ht 69.0 in | Wt 187.0 lb

## 2022-07-24 DIAGNOSIS — G4733 Obstructive sleep apnea (adult) (pediatric): Secondary | ICD-10-CM | POA: Diagnosis not present

## 2022-07-24 DIAGNOSIS — I4819 Other persistent atrial fibrillation: Secondary | ICD-10-CM

## 2022-07-24 NOTE — Progress Notes (Signed)
Electrophysiology Office Note   Date:  07/24/2022   ID:  Joseph Lynn, DOB 1951-01-15, MRN [id number]  PCP:  Joseph Saupe, MD  Cardiologist:  Joseph Lynn Primary Electrophysiologist:  Joseph Scrivener Jorja Loa, MD    Chief Complaint: AF   History of Present Illness: Joseph Lynn is a 72 y.o. male who is being seen today for the evaluation of AF at the request of Joseph Saupe, MD. Presenting today for electrophysiology evaluation.  He has a history significant for hyperlipidemia, obstructive sleep apnea, valvular heart disease, atrial fibrillation.  In 2021 he was donating blood and was found to have an irregular pulse.  01/27/2021 he was in atrial fibrillation.  He is status post ablation 06/06/2021.  He did require cardioversion post ablation but has done well since then.  Today, denies symptoms of palpitations, chest pain, shortness of breath, orthopnea, PND, lower extremity edema, claudication, dizziness, presyncope, syncope, bleeding, or neurologic sequela. The patient is tolerating medications without difficulties.  Today he feels well.  He has no chest pain or shortness of breath.  He is able to do all of his daily activities without restriction.  He is noted no further episodes of atrial fibrillation.   Past Medical History:  Diagnosis Date   Arthritis, degenerative    Erectile dysfunction    Gout    Hymenoptera allergy    Hypercholesteremia    Kidney stones    Left ventricular ejection fraction greater than 40%    OSA (obstructive sleep apnea)    on CPAP   Valvular heart disease    Past Surgical History:  Procedure Laterality Date   ATRIAL FIBRILLATION ABLATION N/A 06/06/2021   Procedure: ATRIAL FIBRILLATION ABLATION;  Surgeon: Regan Lemming, MD;  Location: [facility];  Service: Cardiovascular;  Laterality: N/A;   COLONOSCOPY     X 4   FEMUR SURGERY     NASAL SEPTUM SURGERY     TEE WITHOUT CARDIOVERSION N/A 06/06/2021   Procedure: TRANSESOPHAGEAL  ECHOCARDIOGRAM (TEE);  Surgeon: Regan Lemming, MD;  Location: [facility];  Service: Cardiovascular;  Laterality: N/A;     Current Outpatient Medications  Medication Sig Dispense Refill   calcium carbonate (TUMS - DOSED IN MG ELEMENTAL CALCIUM) 500 MG chewable tablet Chew 1 tablet by mouth daily as needed for heartburn.     EPINEPHrine 0.3 mg/0.3 mL IJ SOAJ injection Inject 0.3 mg into the muscle as needed for anaphylaxis.     fluticasone (FLONASE) 50 MCG/ACT nasal spray SMARTSIG:1-2 Spray(s) Both Nares Daily     polycarbophil (FIBERCON) 625 MG tablet Take 625 mg by mouth with breakfast, with lunch, and with evening meal.     pravastatin (PRAVACHOL) 20 MG tablet Take 20 mg by mouth daily.     sildenafil (VIAGRA) 50 MG tablet Take 50 mg by mouth daily as needed for erectile dysfunction.     No current facility-administered medications for this visit.    Allergies:   Bee venom, Flecainide, and Wasp venom   Social History:  The patient  reports that he has never smoked. He has never used smokeless tobacco. He reports current alcohol use of about 7.0 - 14.0 standard drinks of alcohol per week. He reports that he does not use drugs.   Family History:  The patient's family history includes Cerebral palsy in his sister; Coronary artery disease in his father; Lung cancer in his father; Mental illness in his mother and sister; Ovarian cancer in  his mother.   ROS:  Please see the history of present illness.   Otherwise, review of systems is positive for none.   All other systems are reviewed and negative.   PHYSICAL EXAM: VS:  BP 120/72   Pulse (!) 52   Ht  (1.753 m)   Wt 187 lb (84.8 kg)   BMI 27.62 kg/m  , BMI Body mass index is 27.62 kg/m. GEN: Well nourished, well developed, in no acute distress  HEENT: normal  Neck: no JVD, carotid bruits, or masses Cardiac: RRR; no murmurs, rubs, or gallops,no edema  Respiratory:  clear to auscultation bilaterally, normal work of  breathing GI: soft, nontender, nondistended, + BS MS: no deformity or atrophy  Skin: warm and dry Neuro:  Strength and sensation are intact Psych: euthymic mood, full affect  EKG:  EKG is ordered today. Personal review of the ekg ordered shows sinus rhythm   Recent Labs: No results found for requested labs within last 365 days.    Lipid Panel  No results found for: "CHOL", "TRIG", "HDL", "CHOLHDL", "VLDL", "LDLCALC", "LDLDIRECT"   Wt Readings from Last 3 Encounters:  07/24/22 187 lb (84.8 kg)  09/08/21 196 lb 9.6 oz (89.2 kg)  07/16/21 199 lb (90.3 kg)      Other studies Reviewed: Additional studies/ records that were reviewed today include: TTE February 2021 Review of the above records today demonstrates:  Ejection fraction 57% Right ventricle normal size and function Trivial physiologic valvular regurgitation Right and left atrium normal size   ASSESSMENT AND PLAN:  1.  Persistent atrial fibrillation: CHA2DS2-VASc of 1.  Status post ablation 06/06/2021.  Not anticoagulated due to low stroke risk.  He remains in sinus rhythm without further atrial fibrillation.  Evea Sheek continue with current management.  2.  Obstructive sleep apnea: CPAP compliance encouraged   Current medicines are reviewed at length with the patient today.   The patient does not have concerns regarding his medicines.  The following changes were made today: *  Labs/ tests ordered today include:  Orders Placed This Encounter  Procedures   EKG 12-Lead     Disposition:   FU 12 months  Signed, Delayni Streed Jorja Loa, MD  07/24/2022 9:55 AM     [facility] [address] [place] [address] [phone] (office) [phone] (fax)

## 2022-07-24 NOTE — Patient Instructions (Signed)
Medication Instructions:  °Your physician recommends that you continue on your current medications as directed. Please refer to the Current Medication list given to you today. ° °*If you need a refill on your cardiac medications before your next appointment, please call your pharmacy* ° ° °Lab Work: °None ordered ° ° °Testing/Procedures: °None ordered ° ° °Follow-Up: °At [facility], you and your health needs are our priority.  As part of our continuing mission to provide you with exceptional heart care, we have created designated Provider Care Teams.  These Care Teams include your primary Cardiologist (physician) and Advanced Practice Providers (APPs -  Physician Assistants and Nurse Practitioners) who all work together to provide you with the care you need, when you need it. ° °Your next appointment:   °1 year(s) ° °The format for your next appointment:   °In Person ° °Provider:   °Will Camnitz, MD ° ° ° °Thank you for choosing [facility]!! ° ° °Shwanda Soltis, RN °[phone] ° ° ° °

## 2023-02-28 IMAGING — CT CT HEART MORPH/PULM VEIN W/ CM & W/O CA SCORE
2 of 6 series · 12 of 20 positions shown, 14 images · IV contrast (Omni 300)
Comparison: None.
COMPARISON: None.

Addendum:
EXAM:
OVER-READ INTERPRETATION  CT CHEST

The following report is an over-read performed by radiologist Dr.
Wirkom Delegue [[email or web address]] on 05/30/2021. This over-read
does not include interpretation of cardiac or coronary anatomy or
pathology. The coronary CTA interpretation by the cardiologist is
attached.
CLINICAL DATA: Atrial fibrillation scheduled for an ablation.
Cardiac CT/CTA
TECHNIQUE: The patient was scanned on a Siemens Somatom scanner.

[Series 9: 0-90% · axial · 0.36mm/px · z∈[+1201,+1299]mm · 6 of 2750 slices shown, 8 images]
[im 393/2750  vessel]
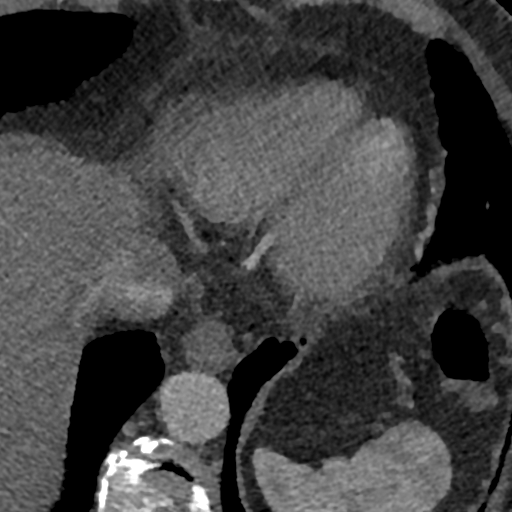
[im 393/2750  lung]
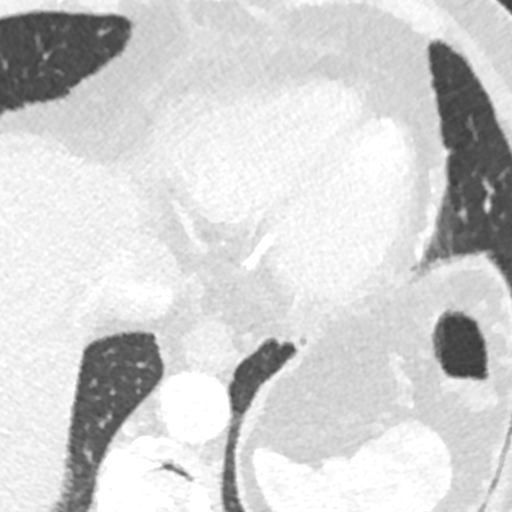
[im 786/2750  vessel]
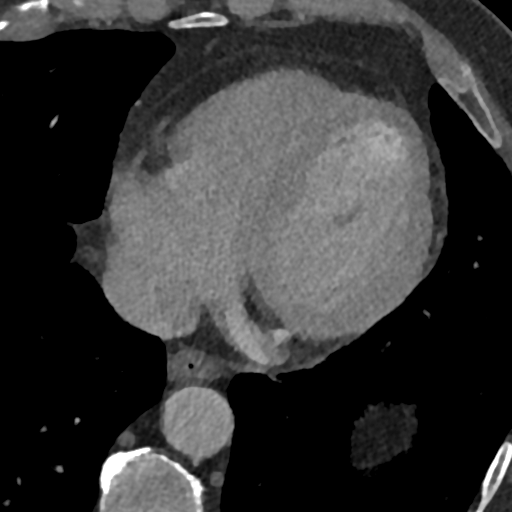
[im 1179/2750  vessel]
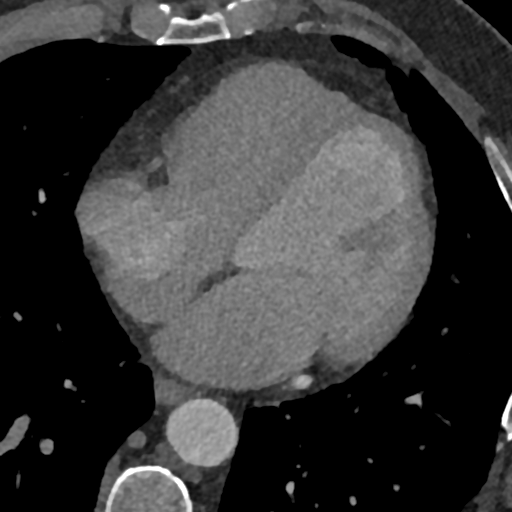
[im 1571/2750  vessel]
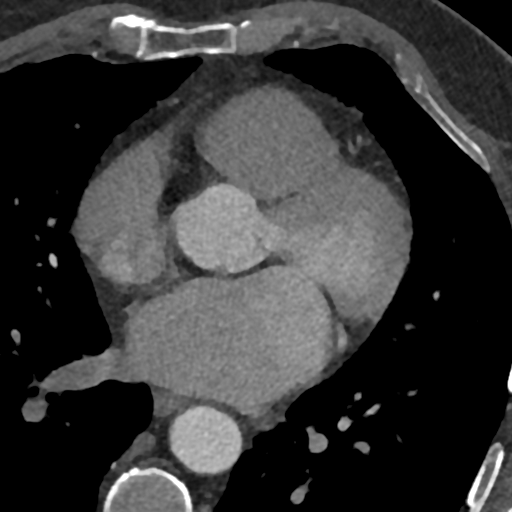
[im 1964/2750  vessel]
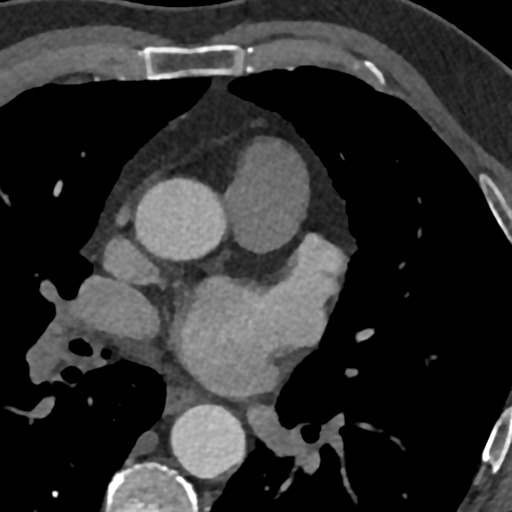
[im 1964/2750  lung]
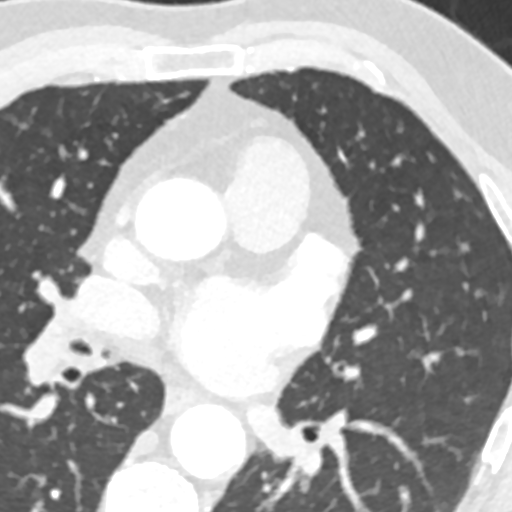
[im 2357/2750  vessel]
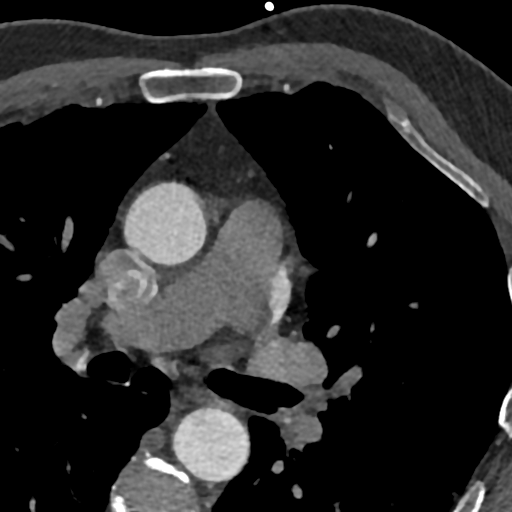

[Series 14: 5-95% · axial · 0.36mm/px · z∈[+1201,+1301]mm · 6 of 2790 slices shown]
[im 399/2790  vessel]
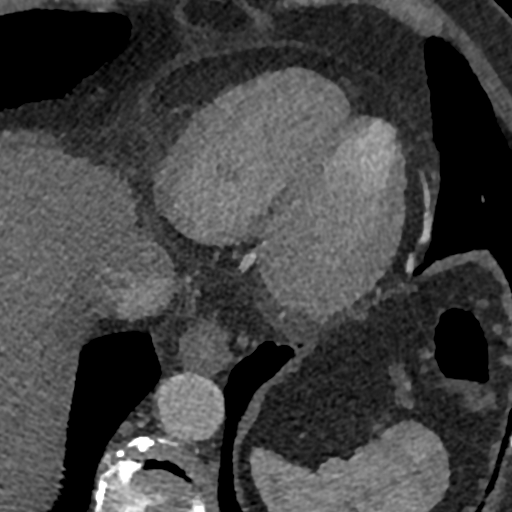
[im 797/2790  vessel]
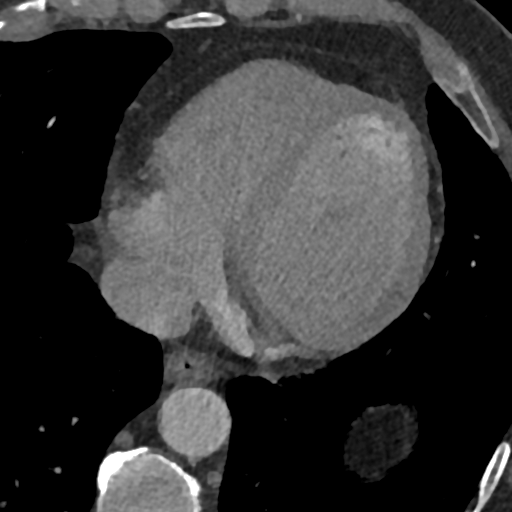
[im 1196/2790  vessel]
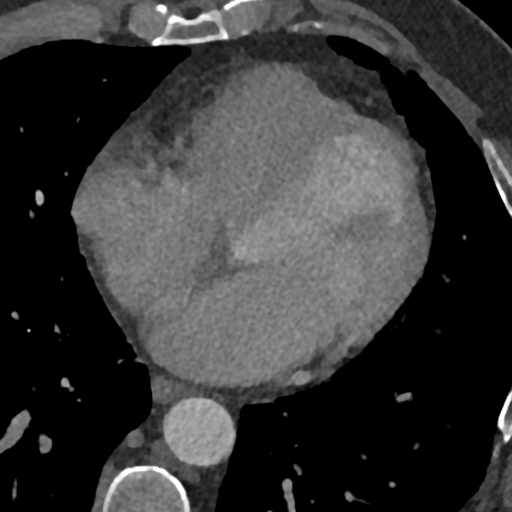
[im 1594/2790  vessel]
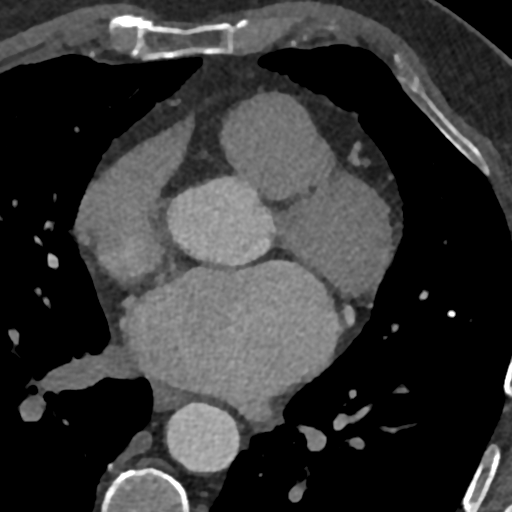
[im 1993/2790  vessel]
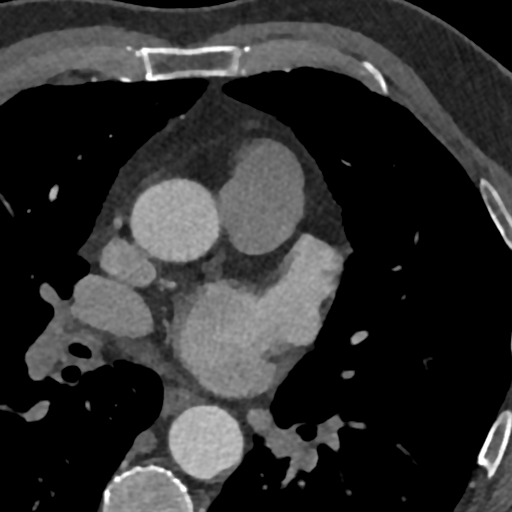
[im 2391/2790  vessel]
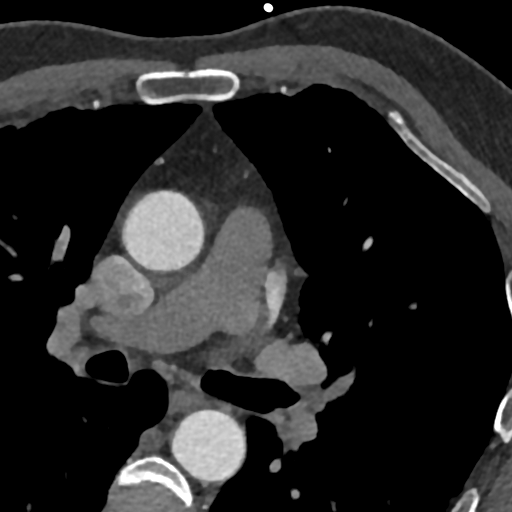

[12 of 20 positions shown; findings below may reference images not displayed]

FINDINGS: Vascular: Aortic atherosclerosis. Pulmonary arteries are not well
opacified secondary to bolus timing.

Mediastinum/Nodes: No imaged thoracic adenopathy. Small hiatal
hernia.

Lungs/Pleura: Scattered calcified granulomas. Pulmonary nodules of
maximally 3-4 mm are not definitely calcified. Example right upper
lobe 3 mm on [DATE], left upper lobe at 3-4 mm on [DATE] and left
lower lobe 2 mm in [DATE].

Upper Abdomen: Normal imaged portions of the liver, spleen, stomach.

Musculoskeletal: No acute osseous abnormality.
IMPRESSION: No acute findings in the imaged extracardiac chest.

Aortic Atherosclerosis (4S2ID-LGW.W).

Tiny bilateral pulmonary nodules. No follow-up needed if patient is
low-risk. Non-contrast chest CT can be considered in 12 months if
patient is high-risk. This recommendation follows the consensus
statement: Guidelines for Management of Incidental Pulmonary Nodules
Detected on CT Images: From the [HOSPITAL] 2932; Radiology

Small hiatal hernia.
FINDINGS: A 120 kV prospective scan was triggered in the descending thoracic
aorta at 111 HU's. Gantry rotation speed was 280 msecs and
collimation was .9 mm. No beta blockade and no NTG was given. The 3D
data set was reconstructed in 5% intervals of the 0-90% of the R-R
cycle. Diastolic phases were analyzed on a dedicated work station
using MPR, MIP and VRT modes. The patient received 80 cc of
contrast.

There is normal pulmonary vein drainage into the left atrium (2 on
the right and 2 on the left) with ostial measurements as follows:

RSPV: 21.4 x 16.5 mm, area 2.84 cm2

RIPV: 17.2 x 15.7 mm, area 2.03 cm2

LSPV: 22.8 x 15.6 mm, area 2.83 cm2

LIPV: 19.9 x 16.8, area 2.52 cm2

The left atrial appendage is large chicken wing / broccoli type with
a single lobe and ostial size 22 x 19 mm and length 30 mm. There is
no thrombus in the left atrial appendage.

The esophagus runs in the left atrial midline and is not in the
proximity to any of the pulmonary veins.

Aorta:  Normal caliber.  No dissection.  Aortic atherosclerosis.

Aortic Valve:  Trileaflet.  No calcifications.

Coronary Arteries: Normal coronary origin. Right dominance. The
study was performed without use of NTG and insufficient for plaque
evaluation.

Calcium score: Coronary calcium score of 1.83. This was 17th
percentile for age-, race-, and sex-matched controls.
IMPRESSION: 1. There is normal pulmonary vein drainage into the left atrium.

2. The left atrial appendage is large chicken wing / broccoli type
with a single lobe and ostial size 22 x 19 mm and length 30 mm.
There is no thrombus in the left atrial appendage.

3. The esophagus runs in the left atrial midline and is not in the
proximity to any of the pulmonary veins.

4. Calcium score: Coronary calcium score of 1.83. This was 17th
percentile for age-, race-, and sex-matched controls.

5. Aortic atherosclerosis.

*** End of Addendum ***
EXAM:
OVER-READ INTERPRETATION  CT CHEST

The following report is an over-read performed by radiologist Dr.
Wirkom Delegue [[email or web address]] on 05/30/2021. This over-read
does not include interpretation of cardiac or coronary anatomy or
pathology. The coronary CTA interpretation by the cardiologist is
attached.
FINDINGS: Vascular: Aortic atherosclerosis. Pulmonary arteries are not well
opacified secondary to bolus timing.

Mediastinum/Nodes: No imaged thoracic adenopathy. Small hiatal
hernia.

Lungs/Pleura: Scattered calcified granulomas. Pulmonary nodules of
maximally 3-4 mm are not definitely calcified. Example right upper
lobe 3 mm on [DATE], left upper lobe at 3-4 mm on [DATE] and left
lower lobe 2 mm in [DATE].

Upper Abdomen: Normal imaged portions of the liver, spleen, stomach.

Musculoskeletal: No acute osseous abnormality.
IMPRESSION: No acute findings in the imaged extracardiac chest.

Aortic Atherosclerosis (4S2ID-LGW.W).

Tiny bilateral pulmonary nodules. No follow-up needed if patient is
low-risk. Non-contrast chest CT can be considered in 12 months if
patient is high-risk. This recommendation follows the consensus
statement: Guidelines for Management of Incidental Pulmonary Nodules
Detected on CT Images: From the [HOSPITAL] 2932; Radiology

Small hiatal hernia.

## 2023-04-08 IMAGING — DX DG CHEST 1V PORT
1 series · 1 of 1 positions shown · non-contrast
Comparison: None.

CLINICAL DATA: Atrial fibrillation

EXAM:
PORTABLE CHEST 1 VIEW

[chest]
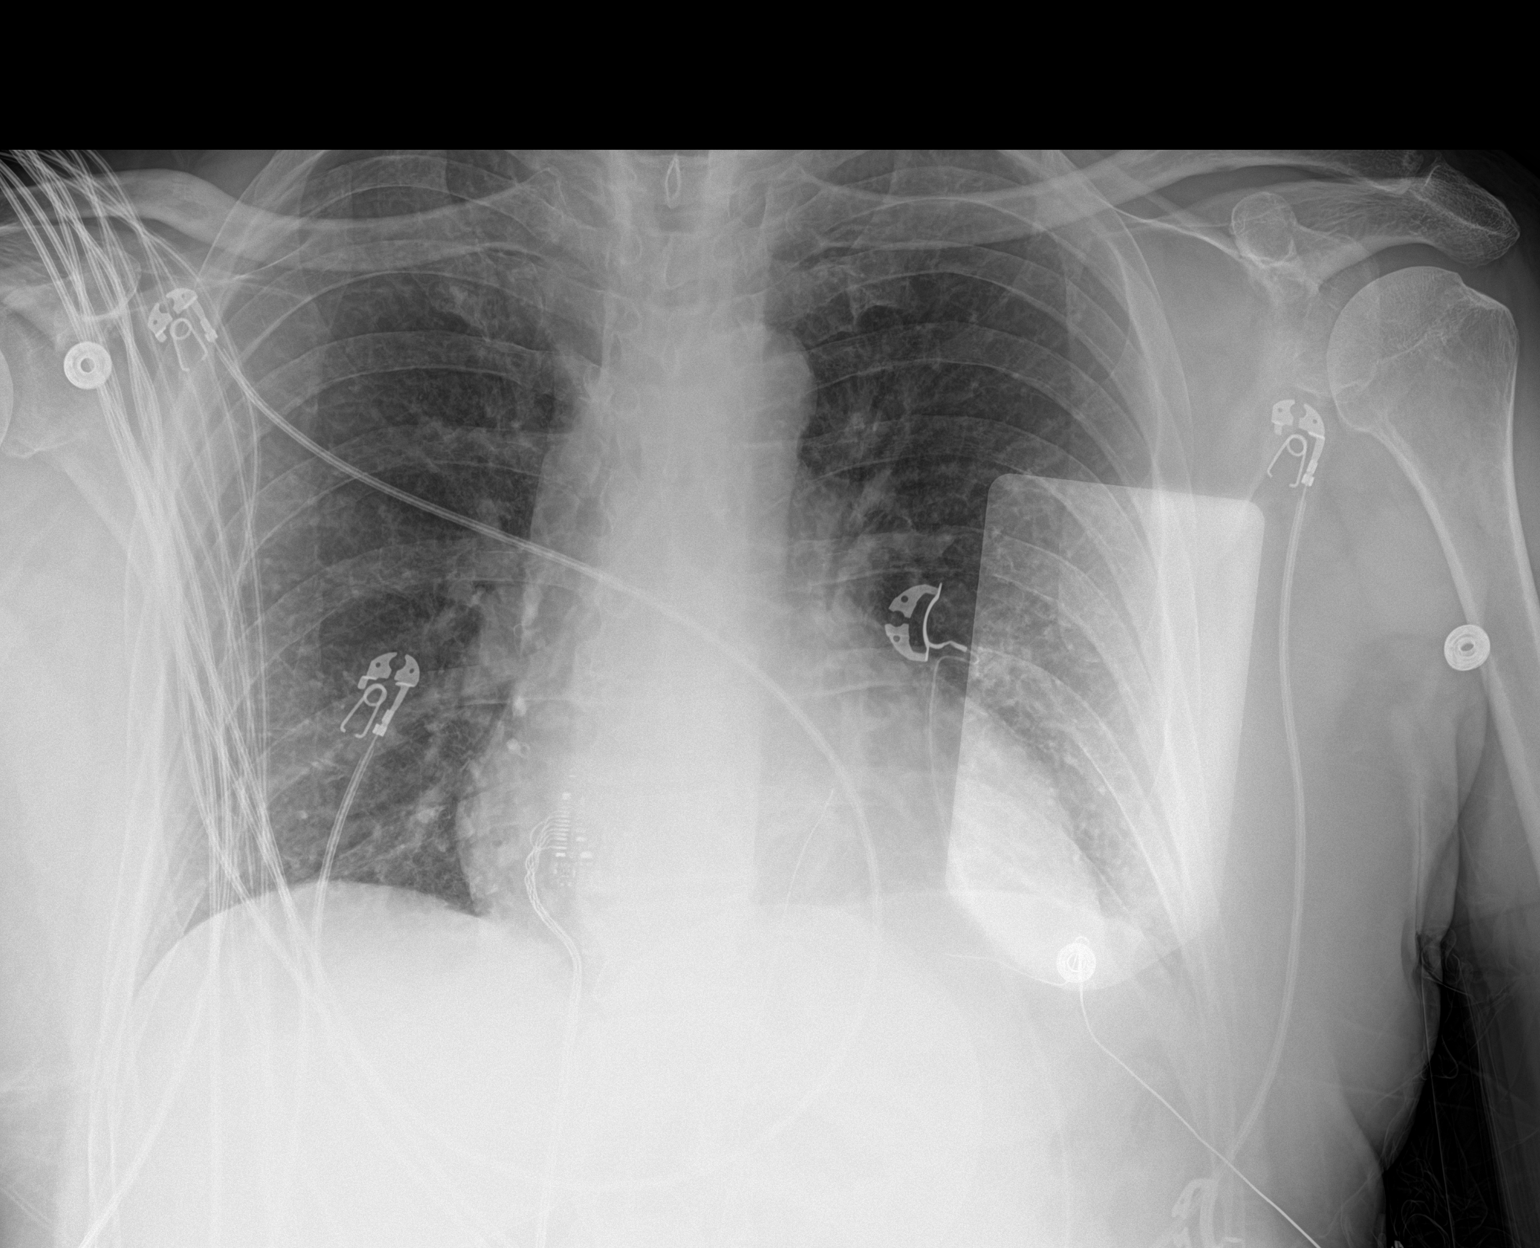

[1 of 1 positions shown; findings below may reference images not displayed]

FINDINGS: External cardiac pad over the LEFT hemithorax. Normal mediastinum
and cardiac silhouette. Normal pulmonary vasculature. No evidence of
effusion, infiltrate, or pneumothorax. No acute bony abnormality.
IMPRESSION: No acute cardiopulmonary process.

## 2023-07-01 ENCOUNTER — Other Ambulatory Visit (HOSPITAL_BASED_OUTPATIENT_CLINIC_OR_DEPARTMENT_OTHER): Payer: Self-pay | Admitting: Physician Assistant

## 2023-07-01 DIAGNOSIS — N453 Epididymo-orchitis: Secondary | ICD-10-CM

## 2023-07-02 ENCOUNTER — Ambulatory Visit (HOSPITAL_BASED_OUTPATIENT_CLINIC_OR_DEPARTMENT_OTHER)
Admission: RE | Admit: 2023-07-02 | Discharge: 2023-07-02 | Disposition: A | Discharge status: Home / Self Care | Source: Ambulatory Visit | Attending: Physician Assistant | Admitting: Physician Assistant

## 2023-07-02 DIAGNOSIS — N453 Epididymo-orchitis: Secondary | ICD-10-CM | POA: Insufficient documentation

## 2023-07-21 NOTE — Progress Notes (Unsigned)
 Electrophysiology Office Note:   Date:  07/22/2023  ID:  RESHAD SAAB, DOB 1950/08/08, MRN [id number]  Primary Cardiologist: None Primary Heart Failure: None Electrophysiologist: Will Cortland Ding, MD      History of Present Illness:   Joseph Lynn is a 73 y.o. male with h/o AF, VHD, HLD, OSA, arthritis seen today for routine electrophysiology followup.   Since last being seen in our clinic the patient reports he has been fatigued.  He noticed when he started taking statins he developed muscle aches and fatigue.  He was changed from pravastatin to rosuvastatin and has had some relief but continues to have fatigue / muscle aches in his thighs and hands.  He does not have chest pain or SOB.  He feels his ability to do things is not as it was before starting statins.   He denies chest pain, palpitations, dyspnea, PND, orthopnea, nausea, vomiting, dizziness, syncope, edema, weight gain, or early satiety.   Review of systems complete and found to be negative unless listed in HPI.   EP Information / Studies Reviewed:    EKG is ordered today. Personal review as below.  EKG Interpretation Date/Time:  Thursday July 22 2023 10:20:17 EDT Ventricular Rate:  50 PR Interval:  142 QRS Duration:  96 QT Interval:  436 QTC Calculation: 397 R Axis:   1  Text Interpretation: Sinus bradycardia Confirmed by Creighton Doffing ([id number]) on 07/22/2023 10:31:13 AM   Studies:  ECHO 05/2019 > LVEF 57% TEE 07/2021 > LVEF 50%, LA severely dilated, RA mod dilated, no PFO/ASD, trivial MV regurgitation EPS 06/06/21 > AF on presentation, successful electrical isolation & anatomical encircling of all four pulmonary veins with RF current, additional LA ablation performed with a standard box lesion  Arrhythmia / AAD AF > initial dx 2021   DCCV 07/08/21 (for rapid AFL) > did not cardiovert Flecainide 2023 (while in ER for AFL)   Risk Assessment/Calculations:    CHA2DS2-VASc Score = 1   This indicates a 0.6%  annual risk of stroke. The patient's score is based upon: CHF History: 0 HTN History: 0 Diabetes History: 0 Stroke History: 0 Vascular Disease History: 0 Age Score: 1 Gender Score: 0               Physical Exam:   VS:  BP 130/62   Ht 5\' 9"  (1.753 m)   Wt 188 lb 12.8 oz (85.6 kg)   SpO2 98%   BMI 27.88 kg/m    Wt Readings from Last 3 Encounters:  07/22/23 188 lb 12.8 oz (85.6 kg)  07/24/22 187 lb (84.8 kg)  09/08/21 196 lb 9.6 oz (89.2 kg)     GEN: Well nourished, well developed in no acute distress NECK: No JVD; No carotid bruits CARDIAC: Regular rate and rhythm, no murmurs, rubs, gallops RESPIRATORY:  Clear to auscultation without rales, wheezing or rhonchi  ABDOMEN: Soft, non-tender, non-distended EXTREMITIES:  No edema; No deformity   ASSESSMENT AND PLAN:    Persistent Atrial Fibrillation  Atrial Flutter  CHA2DS2-VASc 1, s/p PVI ablation 06/06/2021 -not on OAC due to low stroke risk  -EKG with NSR  -no symptom burden   -monitors with blood pressure cuff / no symptom burden   Fatigue / Muscle Aches  -suspect statin related  -refer to [facility] Clinic for Repatha review  -update ECHO to ensure to no change in EF, wall motion   OSA  -CPAP compliant   Follow up with EP APP in 3 months  Signed,  Creighton Doffing, NP-C, AGACNP-BC [facility] - Electrophysiology  07/22/2023, 11:53 AM

## 2023-07-22 ENCOUNTER — Encounter: Payer: Self-pay | Admitting: Pulmonary Disease

## 2023-07-22 ENCOUNTER — Ambulatory Visit: Attending: Pulmonary Disease | Admitting: Pulmonary Disease

## 2023-07-22 VITALS — BP 130/62 | Ht 69.0 in | Wt 188.8 lb

## 2023-07-22 DIAGNOSIS — I4819 Other persistent atrial fibrillation: Secondary | ICD-10-CM | POA: Diagnosis not present

## 2023-07-22 DIAGNOSIS — G4733 Obstructive sleep apnea (adult) (pediatric): Secondary | ICD-10-CM | POA: Diagnosis not present

## 2023-07-22 DIAGNOSIS — Z79899 Other long term (current) drug therapy: Secondary | ICD-10-CM

## 2023-07-22 DIAGNOSIS — I484 Atypical atrial flutter: Secondary | ICD-10-CM

## 2023-07-22 NOTE — Patient Instructions (Addendum)
 Medication Instructions:   Your physician recommends that you continue on your current medications as directed. Please refer to the Current Medication list given to you today.   *If you need a refill on your cardiac medications before your next appointment, please call your pharmacy*    Lab Work:  NONE ORDERED  TODAY    If you have labs (blood work) drawn today and your tests are completely normal, you will receive your results only by: MyChart Message (if you have MyChart) OR A paper copy in the mail If you have any lab test that is abnormal or we need to change your treatment, we will call you to review the results.    Testing/Procedures: Your physician has requested that you have an echocardiogram. Echocardiography is a painless test that uses sound waves to create images of your heart. It provides your doctor with information about the size and shape of your heart and how well your heart's chambers and valves are working. This procedure takes approximately one hour. There are no restrictions for this procedure. Please do NOT wear cologne, perfume, aftershave, or lotions (deodorant is allowed). Please arrive 15 minutes prior to your appointment time.  Please note: We ask at that you not bring children with you during ultrasound (echo/ vascular) testing. Due to room size and safety concerns, children are not allowed in the ultrasound rooms during exams. Our front office staff cannot provide observation of children in our lobby area while testing is being conducted. An adult accompanying a patient to their appointment will only be allowed in the ultrasound room at the discretion of the ultrasound technician under special circumstances. We apologize for any inconvenience.    Follow-Up: At [facility], you and your health needs are our priority.  As part of our continuing mission to provide you with exceptional heart care, our providers are all part of one team.  This team  includes your primary Cardiologist (physician) and Advanced Practice Providers or APPs (Physician Assistants and Nurse Practitioners) who all work together to provide you with the care you need, when you need it.  Your next appointment: 1-2 WEEKS WITH  PHARM-D  FOR MEDICATION MANAGEMENT    3 month(s) ( CONTACT  CASSIE HALL/ ANGELINE HAMMER FOR EP SCHEDULING ISSUES )   Provider:    Canary Brim, NP    We recommend signing up for the patient portal called "MyChart".  Sign up information is provided on this After Visit Summary.  MyChart is used to connect with patients for Virtual Visits (Telemedicine).  Patients are able to view lab/test results, encounter notes, upcoming appointments, etc.  Non-urgent messages can be sent to your provider as well.   To learn more about what you can do with MyChart, go to [email or web address].   Other Instructions        1st Floor: - Lobby - Registration  - Pharmacy  - Lab - Cafe  2nd Floor: - PV Lab - Diagnostic Testing (echo, CT, nuclear med)  3rd Floor: - Vacant  4th Floor: - TCTS (cardiothoracic surgery) - AFib Clinic - Structural Heart Clinic - Vascular Surgery  - Vascular Ultrasound  5th Floor: - HeartCare Cardiology (general and EP) - Clinical Pharmacy for coumadin, hypertension, lipid, weight-loss medications, and med management appointments    Valet parking services will be available as well.

## 2023-08-05 ENCOUNTER — Telehealth: Payer: Self-pay | Admitting: Pharmacist

## 2023-08-05 NOTE — Progress Notes (Signed)
 Patient ID: [name]                 DOB: [birth year]                    MRN: [id number]      HPI: Joseph Lynn is a 73 y.o. male patient referred to lipid clinic by Joseph Doffing NP. PMH is significant for h/o AF, VHD, HLD, OSA, arthritis, hx of gout and HLD.    He saw NP in April 2025 stated  when he started taking statins he developed muscle aches and fatigue. He was changed from pravastatin to rosuvastatin and has had some relief but continues to have fatigue / muscle aches in his thighs and hands.   Patient presented today with his wife. He is hard of hearing. He does not want to any more statins as he could not tolerate 2 other statins even at lower dose. He was told by NP about injectables. He wants to learn more about injectables and if insurance does not approve he is willing to try atorvastatin - different statin.    Reviewed options for lowering LDL cholesterol, including ezetimibe, PCSK-9 inhibitors, bempedoic acid and inclisiran.  Discussed mechanisms of action, dosing, side effects and potential decreases in LDL cholesterol.  Also reviewed cost information and potential options for patient assistance.  Current Medications:  Intolerances: pravastatin - leg cramping and rosuvastatin 5 mg  - fatigue and muscle aches  Risk Factors: family hx of ASCVD- father MI at age 26,  LDL goal: <70 mg/dl  Last lab: TC 409, LDL 107 TG 62 HDL 64 (03/2023) Diet: fairly healthy, eats home cooked meals.  Social history:  Alcohol: occasional - 1-2 drinks per month  Smoking: never  Exercise:   Family History:  Relation Problem Comments  Mother (Deceased) Mental illness   Ovarian cancer     Father (Deceased) Coronary artery disease   Lung cancer     Sister Metallurgist) Cerebral palsy   Mental illness      Social History:   Labs:  Lipid Panel  No results found for: "CHOL", "TRIG", "HDL", "CHOLHDL", "VLDL", "LDLCALC", "LDLDIRECT", "LABVLDL"  Past Medical History:  Diagnosis Date    Arthritis, degenerative    Erectile dysfunction    Gout    Hymenoptera allergy    Hypercholesteremia    Kidney stones    Left ventricular ejection fraction greater than 40%    OSA (obstructive sleep apnea)    on CPAP   Valvular heart disease     Current Outpatient Medications on File Prior to Visit  Medication Sig Dispense Refill   calcium carbonate (TUMS - DOSED IN MG ELEMENTAL CALCIUM) 500 MG chewable tablet Chew 1 tablet by mouth daily as needed for heartburn.     EPINEPHrine 0.3 mg/0.3 mL IJ SOAJ injection Inject 0.3 mg into the muscle as needed for anaphylaxis.     rosuvastatin (CRESTOR) 5 MG tablet Take 5 mg by mouth daily.     sildenafil (VIAGRA) 50 MG tablet Take 50 mg by mouth daily as needed for erectile dysfunction.     No current facility-administered medications on file prior to visit.    Allergies  Allergen Reactions   Bee Venom Swelling   Flecainide  Other (See Comments)    Caused high heart rates   Wasp Venom Swelling    Assessment/Plan:  1. Hyperlipidemia -  Problem  Hypercholesteremia   Current Medications:  Intolerances: pravastatin - leg cramping and rosuvastatin 5  mg  - fatigue and muscle aches  Risk Factors: family hx of ASCVD- father MI at age 39,  LDL goal: <70 mg/dl  Last lab: TC 161, LDL 107 TG 62 HDL 64 (03/2023) while on Crestor 5 mg daily     Hypercholesteremia Assessment:  LDL goal: < 70 mg/dl last LDLc 096 mg/dl (07/5407) while on Crestor 5 mg daily  Intolerance to statins pravastatin - leg cramping and rosuvastatin 5 mg  - fatigue and muscle aches  Discussed next potential options (ezetimibe,PCSK-9 inhibitors, bempedoic acid and inclisiran); cost, dosing efficacy, side effects  Doesn't want to try any other statin if insurance approve PCSK9i agent  Follows fairly healthy diet and stay active around the house doing small projects but no set exercise schedule   Plan: Will apply for PA for PCSK9i; will inform patient upon approval  (prefers MyChart message) Lipid lab due in 2-3 months after starting PCSK9i    Thank you,  Nickola Baron, Pharm.D [facility] Division of  [facility] [address], [place], [place] [address]  Phone: [phone]; Fax: [phone]

## 2023-08-06 ENCOUNTER — Other Ambulatory Visit (HOSPITAL_COMMUNITY): Payer: Self-pay

## 2023-08-06 ENCOUNTER — Ambulatory Visit: Attending: Cardiology | Admitting: Pharmacist

## 2023-08-06 ENCOUNTER — Encounter: Payer: Self-pay | Admitting: Pharmacist

## 2023-08-06 ENCOUNTER — Telehealth: Payer: Self-pay

## 2023-08-06 DIAGNOSIS — E7800 Pure hypercholesterolemia, unspecified: Secondary | ICD-10-CM

## 2023-08-06 MED ORDER — REPATHA SURECLICK 140 MG/ML ~~LOC~~ SOAJ: 140.0000 mg | SUBCUTANEOUS | 3 refills | Status: AC

## 2023-08-06 NOTE — Telephone Encounter (Signed)
 Pharmacy Patient Advocate Encounter   Received notification from Physician's Office that prior authorization for REPATHA is required/requested.   Insurance verification completed.   The patient is insured through [facility] .   Per test claim: PA required; PA submitted to above mentioned insurance via CoverMyMeds Key/confirmation #/EOC [id number] Status is pending

## 2023-08-06 NOTE — Assessment & Plan Note (Signed)
 Assessment:  LDL goal: < 70 mg/dl last LDLc 130 mg/dl (86/5784) while on Crestor 5 mg daily  Intolerance to statins pravastatin - leg cramping and rosuvastatin 5 mg  - fatigue and muscle aches  Discussed next potential options (ezetimibe,PCSK-9 inhibitors, bempedoic acid and inclisiran); cost, dosing efficacy, side effects  Doesn't want to try any other statin if insurance approve PCSK9i agent  Follows fairly healthy diet and stay active around the house doing small projects but no set exercise schedule   Plan: Will apply for PA for PCSK9i; will inform patient upon approval (prefers MyChart message) Lipid lab due in 2-3 months after starting PCSK9i

## 2023-08-06 NOTE — Addendum Note (Signed)
 Addended by: Arlayne Liggins K on: 08/06/2023 01:06 PM   Modules accepted: Orders

## 2023-08-06 NOTE — Patient Instructions (Signed)
  Medication changes: We will start the process to get PCSK9i( Repatha or Praluent)  covered by your insurance.  Once the prior authorization is complete, we will call you to let you know and confirm pharmacy information.     Praluent is a cholesterol medication that improved your body's ability to get rid of "bad cholesterol" known as LDL. It can lower your LDL up to 60%. It is an injection that is given under the skin every 2 weeks. The most common side effects of Praluent include runny nose, symptoms of the common cold, rarely flu or flu-like symptoms, back/muscle pain in about 3-4% of the patients, and redness, pain, or bruising at the injection site.    Repatha is a cholesterol medication that improved your body's ability to get rid of "bad cholesterol" known as LDL. It can lower your LDL up to 60%! It is an injection that is given under the skin every 2 weeks. The medication often requires a prior authorization from your insurance company. We will take care of submitting all the necessary information to your insurance company to get it approved. The most common side effects of Repatha include runny nose, symptoms of the common cold, rarely flu or flu-like symptoms, back/muscle pain in about 3-4% of the patients, and redness, pain, or bruising at the injection site.   Lab orders: We want to repeat labs after 2-3 months.  We will send you a lab order to remind you once we get closer to that time.

## 2023-08-06 NOTE — Telephone Encounter (Signed)
 PA request has been Submitted. New Encounter has been or will be created for follow up. For additional info see Pharmacy Prior Auth telephone encounter from 08/06/23.

## 2023-08-06 NOTE — Telephone Encounter (Signed)
 Pharmacy Patient Advocate Encounter  Received notification from [employer] that Prior Authorization for REPATHA has been APPROVED from 08/06/23 to 02/06/24. Ran test claim, Copay is $45. This test claim was processed through [facility]- copay amounts may vary at other pharmacies due to pharmacy/plan contracts, or as the patient moves through the different stages of their insurance plan.

## 2023-08-23 ENCOUNTER — Other Ambulatory Visit (HOSPITAL_COMMUNITY)

## 2023-08-27 ENCOUNTER — Ambulatory Visit (HOSPITAL_COMMUNITY)
Admission: RE | Admit: 2023-08-27 | Discharge: 2023-08-27 | Disposition: A | Discharge status: Home / Self Care | Source: Ambulatory Visit | Attending: Pulmonary Disease | Admitting: Pulmonary Disease

## 2023-08-27 DIAGNOSIS — I4819 Other persistent atrial fibrillation: Secondary | ICD-10-CM | POA: Insufficient documentation

## 2023-08-27 LAB — ECHOCARDIOGRAM COMPLETE
Area-P 1/2: 2.76 cm2
S' Lateral: 3.3 cm

## 2023-08-31 ENCOUNTER — Ambulatory Visit: Payer: Self-pay | Admitting: Pulmonary Disease

## 2023-08-31 NOTE — Telephone Encounter (Signed)
-----   Message from Creighton Doffing sent at 08/31/2023  8:56 AM EDT ----- Please let Mr. Gervin know that his ECHO is stable with normal pump function (LVEF 55-60%).  He does have mild impairment relaxation of the heart (Grade I diastolic dysfunction).  We do not need to change anything at this time.  I suspect his muscle aches / fatigue may be from his statin and we have a referral in for him to the pharmacist for Repatha  review.   Creighton Doffing, NP-C, AGACNP-BC [facility] - Electrophysiology  08/31/2023, 8:56 AM

## 2023-10-13 NOTE — Progress Notes (Unsigned)
  Electrophysiology Office Note:   Date:  10/13/2023  ID:  Joseph Lynn, DOB 1950-10-24, MRN [id number]  Primary Cardiologist: None Primary Heart Failure: None Electrophysiologist: Will Gladis Norton, MD  {Click to update primary MD,subspecialty MD or APP then REFRESH:1}    History of Present Illness:   Joseph Lynn is a 73 y.o. male with h/o AF, VHD, HLD, OSA seen today for routine electrophysiology followup.   Since last being seen in our clinic the patient reports doing ***.    He denies chest pain, palpitations, dyspnea, PND, orthopnea, nausea, vomiting, dizziness, syncope, edema, weight gain, or early satiety.   Review of systems complete and found to be negative unless listed in HPI.   EP Information / Studies Reviewed:    EKG is not ordered today. EKG from 07/22/23 reviewed which showed SB 50 bpm      Studies:  ECHO 05/2019 > LVEF 57% TEE 07/2021 > LVEF 50%, LA severely dilated, RA mod dilated, no PFO/ASD, trivial MV regurgitation EPS 06/06/21 > AF on presentation, successful electrical isolation & anatomical encircling of all four pulmonary veins with RF current, additional LA ablation performed with a standard box lesion ECHO 08/27/23 > LVEF 55-60%, GIDD Arrhythmia / AAD AF > initial dx 2021   DCCV 07/08/21 (for rapid AFL) > did not cardiovert Flecainide  2023 (while in ER for AFL)  Risk Assessment/Calculations:    CHA2DS2-VASc Score = 1  {Confirm score is correct.  If not, click here to update score.  REFRESH note.  :1} This indicates a 0.6% annual risk of stroke. The patient's score is based upon: CHF History: 0 HTN History: 0 Diabetes History: 0 Stroke History: 0 Vascular Disease History: 0 Age Score: 1 Gender Score: 0     No BP recorded.  {Refresh Note OR Click here to enter BP  :1}***        Physical Exam:   VS:  There were no vitals taken for this visit.   Wt Readings from Last 3 Encounters:  07/22/23 188 lb 12.8 oz (85.6 kg)  07/24/22 187 lb (84.8 kg)   09/08/21 196 lb 9.6 oz (89.2 kg)     GEN: Well nourished, well developed in no acute distress NECK: No JVD; No carotid bruits CARDIAC: {EPRHYTHM:28826}, no murmurs, rubs, gallops RESPIRATORY:  Clear to auscultation without rales, wheezing or rhonchi  ABDOMEN: Soft, non-tender, non-distended EXTREMITIES:  No edema; No deformity   ASSESSMENT AND PLAN:    Persistent Atrial Fibrillation  Atrial Flutter  CHA2DS2-VASc 1, s/p PVI ablation 06/06/21 -no OAC with low risk score  -no symptom burden  -monitors with BP cuff  Fatigue / Muscle Aches  -referred to the lipid clinic > pt working with pharmacy on Repatha  ***  OSA  -CPAP compliant    Follow up with Dr. Norton {EPFOLLOW LE:71826}  Signed, Daphne Barrack, NP-C, AGACNP-BC [facility] - Electrophysiology  10/13/2023, 9:35 PM

## 2023-10-14 ENCOUNTER — Encounter: Payer: Self-pay | Admitting: Pulmonary Disease

## 2023-10-14 ENCOUNTER — Ambulatory Visit: Attending: Pulmonary Disease | Admitting: Pulmonary Disease

## 2023-10-14 ENCOUNTER — Ambulatory Visit: Payer: Self-pay | Admitting: Pharmacist

## 2023-10-14 VITALS — BP 136/78 | HR 53 | Ht 69.0 in | Wt 193.2 lb

## 2023-10-14 DIAGNOSIS — I4819 Other persistent atrial fibrillation: Secondary | ICD-10-CM

## 2023-10-14 DIAGNOSIS — I484 Atypical atrial flutter: Secondary | ICD-10-CM | POA: Diagnosis not present

## 2023-10-14 DIAGNOSIS — G4733 Obstructive sleep apnea (adult) (pediatric): Secondary | ICD-10-CM | POA: Diagnosis not present

## 2023-10-14 LAB — LIPID PANEL
Chol/HDL Ratio: 2.6 ratio (ref 0.0–5.0)
Cholesterol, Total: 107 mg/dL (ref 100–199)
HDL: 41 mg/dL (ref >39)
LDL Chol Calc (NIH): 53 mg/dL (ref 0–99)
Triglycerides: 60 mg/dL (ref 0–149)
VLDL Cholesterol Cal: 13 mg/dL (ref 5–40)

## 2023-10-14 NOTE — Telephone Encounter (Signed)
 Lipid lab discussed over the phone. LDL at goal. Advised to continue with Repatha .

## 2023-10-14 NOTE — Patient Instructions (Signed)
 Medication Instructions:  Your physician recommends that you continue on your current medications as directed. Please refer to the Current Medication list given to you today.  *If you need a refill on your cardiac medications before your next appointment, please call your pharmacy*  Lab Work: None ordered If you have labs (blood work) drawn today and your tests are completely normal, you will receive your results only by: MyChart Message (if you have MyChart) OR A paper copy in the mail If you have any lab test that is abnormal or we need to change your treatment, we will call you to review the results.  Follow-Up: At [facility], you and your health needs are our priority.  As part of our continuing mission to provide you with exceptional heart care, our providers are all part of one team.  This team includes your primary Cardiologist (physician) and Advanced Practice Providers or APPs (Physician Assistants and Nurse Practitioners) who all work together to provide you with the care you need, when you need it.  Your next appointment:   1 year(s)  Provider:   You may see Will Gladis Norton, MD or one of the following Advanced Practice Providers on your designated Care Team:   Charlies Arthur, PA-C Michael Andy Tillery, PA-C Suzann Riddle, NP Daphne Barrack, NP   We recommend signing up for the patient portal called MyChart.  Sign up information is provided on this After Visit Summary.  MyChart is used to connect with patients for Virtual Visits (Telemedicine).  Patients are able to view lab/test results, encounter notes, upcoming appointments, etc.  Non-urgent messages can be sent to your provider as well.   To learn more about what you can do with MyChart, go to [email or web address].

## 2024-01-07 ENCOUNTER — Telehealth: Payer: Self-pay | Admitting: Pharmacy Technician

## 2024-01-07 ENCOUNTER — Other Ambulatory Visit (HOSPITAL_COMMUNITY): Payer: Self-pay

## 2024-01-07 NOTE — Telephone Encounter (Signed)
 Pharmacy Patient Advocate Encounter   Received notification from Onbase that prior authorization for REPATHA  is required/requested.   Insurance verification completed.   The patient is insured through [facility].   Per test claim: PA required; PA submitted to above mentioned insurance via Latent Key/confirmation #/EOC [id number] Status is pending

## 2024-01-10 NOTE — Telephone Encounter (Signed)
 Pharmacy Patient Advocate Encounter  Received notification from [employer] that Prior Authorization for repatha  has been APPROVED from 01/10/24 to 02/06/25   PA #/Case ID/Reference #: [id number]

## 2024-03-22 ENCOUNTER — Telehealth (HOSPITAL_BASED_OUTPATIENT_CLINIC_OR_DEPARTMENT_OTHER): Payer: Self-pay

## 2024-03-22 NOTE — Telephone Encounter (Signed)
° °  Pre-operative Risk Assessment    Patient Name: Joseph Lynn  DOB: 27-Jun-1950 MRN: [id number]   Date of last office visit: 10/14/2023 with Daphne Barrack, NP Date of next office visit: None  Request for Surgical Clearance    Procedure:  Right partial knee replacement -medial   Date of Surgery:  Clearance TBD                                 Surgeon:  Dr. Evalene Chancy  Surgeon's Group or Practice Name:  [facility]  Phone number:  [phone] Fax number:  [phone]   Type of Clearance Requested:   - Medical    Type of Anesthesia:  Spinal   Additional requests/questions:  None  Signed, Patrcia Iverson CROME   03/22/2024, 1:00 PM

## 2024-04-18 NOTE — Telephone Encounter (Signed)
 Pt is requesting a callback regarding him following up on this pre op clearance. Please advise

## 2024-04-18 NOTE — Telephone Encounter (Signed)
 Please advise if patient will need office visit or tele visit.

## 2024-04-19 NOTE — Telephone Encounter (Signed)
" ° °  Name: Joseph Lynn  DOB: [birth year]  MRN: [id number]  Primary Cardiologist: Dr. Inocencio   Preoperative team, please contact this patient and set up a phone call appointment for further preoperative risk assessment. Please obtain consent and complete medication review. Thank you for your help.  I confirm that guidance regarding antiplatelet and oral anticoagulation therapy has been completed and, if necessary, noted below. - He is not on any anticoagulation/ antiplatelets.   I also confirmed the patient resides in the state of [place] . As per [facility] Medical Board telemedicine laws, the patient must reside in the state in which the provider is licensed.   Lark Langenfeld E Nafis Farnan, PA-C 04/19/2024, 6:57 AM [facility]    "

## 2024-04-19 NOTE — Telephone Encounter (Signed)
 Patient returned Pre-op call.

## 2024-04-19 NOTE — Addendum Note (Signed)
 Addended by: ETHELENE ASBERRY MATSU on: 04/19/2024 11:39 AM   Modules accepted: Orders

## 2024-04-19 NOTE — Telephone Encounter (Signed)
 Called and left the patient a detailed message to schedule his pre-op appointment. First attempt.

## 2024-04-19 NOTE — Telephone Encounter (Signed)
 Patient scheduled for pre-op clearance on 04/25/24 with Lum Louis, NP.       Patient Consent for Virtual Visit        Joseph Lynn has provided verbal consent on 04/19/2024 for a virtual visit (video or telephone).   CONSENT FOR VIRTUAL VISIT FOR:  Joseph Lynn  By participating in this virtual visit I agree to the following:  I hereby voluntarily request, consent and authorize [facility] and its employed or contracted physicians, physician assistants, nurse practitioners or other licensed health care professionals (the Practitioner), to provide me with telemedicine health care services (the Services) as deemed necessary by the treating Practitioner. I acknowledge and consent to receive the Services by the Practitioner via telemedicine. I understand that the telemedicine visit will involve communicating with the Practitioner through live audiovisual communication technology and the disclosure of certain medical information by electronic transmission. I acknowledge that I have been given the opportunity to request an in-person assessment or other available alternative prior to the telemedicine visit and am voluntarily participating in the telemedicine visit.  I understand that I have the right to withhold or withdraw my consent to the use of telemedicine in the course of my care at any time, without affecting my right to future care or treatment, and that the Practitioner or I may terminate the telemedicine visit at any time. I understand that I have the right to inspect all information obtained and/or recorded in the course of the telemedicine visit and may receive copies of available information for a reasonable fee.  I understand that some of the potential risks of receiving the Services via telemedicine include:  Delay or interruption in medical evaluation due to technological equipment failure or disruption; Information transmitted may not be sufficient (e.g. poor  resolution of images) to allow for appropriate medical decision making by the Practitioner; and/or  In rare instances, security protocols could fail, causing a breach of personal health information.  Furthermore, I acknowledge that it is my responsibility to provide information about my medical history, conditions and care that is complete and accurate to the best of my ability. I acknowledge that Practitioner's advice, recommendations, and/or decision may be based on factors not within their control, such as incomplete or inaccurate data provided by me or distortions of diagnostic images or specimens that may result from electronic transmissions. I understand that the practice of medicine is not an exact science and that Practitioner makes no warranties or guarantees regarding treatment outcomes. I acknowledge that a copy of this consent can be made available to me via my patient portal [facility] MyChart), or I can request a printed copy by calling the office of  HeartCare.    I understand that my insurance will be billed for this visit.   I have read or had this consent read to me. I understand the contents of this consent, which adequately explains the benefits and risks of the Services being provided via telemedicine.  I have been provided ample opportunity to ask questions regarding this consent and the Services and have had my questions answered to my satisfaction. I give my informed consent for the services to be provided through the use of telemedicine in my medical care

## 2024-04-25 ENCOUNTER — Ambulatory Visit: Admitting: Emergency Medicine

## 2024-04-25 DIAGNOSIS — Z01810 Encounter for preprocedural cardiovascular examination: Secondary | ICD-10-CM | POA: Diagnosis not present

## 2024-04-25 NOTE — Progress Notes (Signed)
 "   Virtual Visit via Telephone Note   Because of Joseph Lynn co-morbid illnesses, he is at least at moderate risk for complications without adequate follow up.  This format is felt to be most appropriate for this patient at this time.  Due to technical limitations with video connection (technology), today's appointment will be conducted as an audio only telehealth visit, and Joseph Lynn verbally agreed to proceed in this manner.   All issues noted in this document were discussed and addressed.  No physical exam could be performed with this format.  Evaluation Performed:  Preoperative cardiovascular risk assessment _____________   Date:  04/25/2024   Patient ID:  Joseph Lynn, DOB [birth year], MRN [id number] Patient Location:  Home Provider location:   Office  Primary Care Provider:  Sheldon Netter, PA Primary Cardiologist:  None  Chief Complaint / Patient Profile   74 y.o. y/o male with a h/o persistent atrial fibrillation, atrial flutter, obstructive sleep apnea, hyperlipidemia who is pending right partial knee replacement on date TBD with [facility] and presents today for telephonic preoperative cardiovascular risk assessment.  History of Present Illness    Joseph Lynn is a 74 y.o. male who presents via audio/video conferencing for a telehealth visit today.  Pt was last seen in cardiology clinic on 10/14/2023 by Hildegarde Barrack, NP.  At that time Joseph Lynn was doing well.  The patient is now pending procedure as outlined above. Since his last visit, he is doing well without acute cardiovascular concerns or complaints.  He denies any chest pains, dyspnea, orthopnea, syncope/presyncope, or dizziness.  He denies any symptoms concerning for recurrent atrial fibrillation and is without any palpitations.  He remains active without exertional symptoms.  He is without any symptoms of angina.  He is able to complete greater than 4 METS.  Past Medical History    Past Medical  History:  Diagnosis Date   Arthritis, degenerative    Erectile dysfunction    Gout    Hymenoptera allergy    Hypercholesteremia    Kidney stones    Left ventricular ejection fraction greater than 40%    OSA (obstructive sleep apnea)    on CPAP   Valvular heart disease    Past Surgical History:  Procedure Laterality Date   ATRIAL FIBRILLATION ABLATION N/A 06/06/2021   Procedure: ATRIAL FIBRILLATION ABLATION;  Surgeon: Inocencio Soyla Lunger, MD;  Location: [facility] INVASIVE CV LAB;  Service: Cardiovascular;  Laterality: N/A;   COLONOSCOPY     X 4   FEMUR SURGERY     NASAL SEPTUM SURGERY     TEE WITHOUT CARDIOVERSION N/A 06/06/2021   Procedure: TRANSESOPHAGEAL ECHOCARDIOGRAM (TEE);  Surgeon: Inocencio Soyla Lunger, MD;  Location: [facility] INVASIVE CV LAB;  Service: Cardiovascular;  Laterality: N/A;    Allergies  Allergies[1]  Home Medications    Prior to Admission medications  Medication Sig Start Date End Date Taking? Authorizing Provider  calcium carbonate (TUMS - DOSED IN MG ELEMENTAL CALCIUM) 500 MG chewable tablet Chew 1 tablet by mouth daily as needed for heartburn.    [provider]  EPINEPHrine 0.3 mg/0.3 mL IJ SOAJ injection Inject 0.3 mg into the muscle as needed for anaphylaxis.    [provider]  Evolocumab  (REPATHA  SURECLICK) 140 MG/ML SOAJ Inject 140 mg into the skin every 14 (fourteen) days. 08/06/23   Monetta Redell PARAS, MD  sildenafil (VIAGRA) 50 MG tablet Take 50 mg by mouth daily as needed for erectile dysfunction. Patient not  taking: Reported on 04/19/2024    [provider]  tadalafil (CIALIS) 10 MG tablet Take 10 mg by mouth daily as needed.    [provider]    Physical Exam    Vital Signs:  Joseph Lynn does not have vital signs available for review today.  Given telephonic nature of communication, physical exam is limited. AAOx3. NAD. Normal affect.  Speech and respirations are unlabored.  Accessory Clinical Findings     None  Assessment & Plan    1.  Preoperative Cardiovascular Risk Assessment: According to the Revised Cardiac Risk Index (RCRI), his Perioperative Risk of Major Cardiac Event is (%): 0.4. His Functional Capacity in METs is: 5.62 according to the [employer] Activity Status Index (DASI).  Therefore, based on ACC/AHA guidelines, patient would be at acceptable risk for the planned procedure without further cardiovascular testing. I will route this recommendation to the requesting party via Epic fax function.  The patient was advised that if he develops new symptoms prior to surgery to contact our office to arrange for a follow-up visit, and he verbalized understanding.   A copy of this note will be routed to requesting surgeon.  Time:   Today, I have spent 10 minutes with the patient with telehealth technology discussing medical history, symptoms, and management plan.     Joseph LITTIE Louis, NP  04/25/2024, 9:56 AM     [1]  Allergies Allergen Reactions   Bee Venom Swelling   Flecainide  Other (See Comments)    Caused high heart rates   Pravastatin Other (See Comments)    Muscle pain    Rosuvastatin Other (See Comments)    Muscle pain    Wasp Venom Swelling   "
